# Patient Record
Sex: Female | Born: 1935 | Race: White | Hispanic: No | Smoking: Never smoker
Health system: Southern US, Community
[De-identification: ages and names within clinical notes are randomized; demographics above are authoritative.]

## PROBLEM LIST (undated history)

## (undated) DIAGNOSIS — H3530 Unspecified macular degeneration: Secondary | ICD-10-CM

## (undated) DIAGNOSIS — E039 Hypothyroidism, unspecified: Secondary | ICD-10-CM

## (undated) DIAGNOSIS — S3210XA Unspecified fracture of sacrum, initial encounter for closed fracture: Secondary | ICD-10-CM

## (undated) DIAGNOSIS — N3010 Interstitial cystitis (chronic) without hematuria: Secondary | ICD-10-CM

## (undated) DIAGNOSIS — M545 Low back pain: Secondary | ICD-10-CM

## (undated) DIAGNOSIS — E785 Hyperlipidemia, unspecified: Secondary | ICD-10-CM

## (undated) DIAGNOSIS — H409 Unspecified glaucoma: Secondary | ICD-10-CM

## (undated) DIAGNOSIS — I340 Nonrheumatic mitral (valve) insufficiency: Secondary | ICD-10-CM

## (undated) DIAGNOSIS — K635 Polyp of colon: Secondary | ICD-10-CM

## (undated) DIAGNOSIS — M810 Age-related osteoporosis without current pathological fracture: Secondary | ICD-10-CM

## (undated) DIAGNOSIS — M8580 Other specified disorders of bone density and structure, unspecified site: Secondary | ICD-10-CM

## (undated) DIAGNOSIS — F0390 Unspecified dementia without behavioral disturbance: Secondary | ICD-10-CM

## (undated) DIAGNOSIS — M4848XA Fatigue fracture of vertebra, sacral and sacrococcygeal region, initial encounter for fracture: Secondary | ICD-10-CM

## (undated) DIAGNOSIS — E079 Disorder of thyroid, unspecified: Secondary | ICD-10-CM

## (undated) DIAGNOSIS — C4490 Unspecified malignant neoplasm of skin, unspecified: Secondary | ICD-10-CM

## (undated) DIAGNOSIS — N3281 Overactive bladder: Secondary | ICD-10-CM

## (undated) HISTORY — PX: CATARACT EXTRACTION: SUR2

## (undated) HISTORY — DX: Hypothyroidism, unspecified: E03.9

## (undated) HISTORY — DX: Unspecified fracture of sacrum, initial encounter for closed fracture: S32.10XA

## (undated) HISTORY — DX: Low back pain: M54.5

## (undated) HISTORY — DX: Polyp of colon: K63.5

## (undated) HISTORY — DX: Interstitial cystitis (chronic) without hematuria: N30.10

## (undated) HISTORY — DX: Fatigue fracture of vertebra, sacral and sacrococcygeal region, initial encounter for fracture: M48.48XA

## (undated) HISTORY — DX: Unspecified malignant neoplasm of skin, unspecified: C44.90

## (undated) HISTORY — DX: Age-related osteoporosis without current pathological fracture: M81.0

## (undated) HISTORY — DX: Nonrheumatic mitral (valve) insufficiency: I34.0

## (undated) HISTORY — DX: Overactive bladder: N32.81

## (undated) HISTORY — DX: Unspecified macular degeneration: H35.30

## (undated) HISTORY — PX: TONSILLECTOMY: SUR1361

## (undated) HISTORY — DX: Hyperlipidemia, unspecified: E78.5

## (undated) HISTORY — DX: Other specified disorders of bone density and structure, unspecified site: M85.80

---

## 2001-06-14 HISTORY — PX: CHOLECYSTECTOMY: SHX55

## 2004-07-22 ENCOUNTER — Ambulatory Visit: Payer: Self-pay | Admitting: Unknown Physician Specialty

## 2005-03-11 ENCOUNTER — Ambulatory Visit: Payer: Self-pay | Admitting: Internal Medicine

## 2006-03-14 ENCOUNTER — Ambulatory Visit: Payer: Self-pay | Admitting: Internal Medicine

## 2006-07-18 ENCOUNTER — Ambulatory Visit: Payer: Self-pay | Admitting: Urology

## 2007-03-20 ENCOUNTER — Ambulatory Visit: Payer: Self-pay | Admitting: Internal Medicine

## 2008-02-01 ENCOUNTER — Ambulatory Visit: Payer: Self-pay | Admitting: Internal Medicine

## 2008-03-20 ENCOUNTER — Ambulatory Visit: Payer: Self-pay | Admitting: Internal Medicine

## 2008-10-30 ENCOUNTER — Inpatient Hospital Stay: Payer: Self-pay | Admitting: Internal Medicine

## 2008-11-23 ENCOUNTER — Emergency Department: Payer: Self-pay | Admitting: Emergency Medicine

## 2009-03-20 ENCOUNTER — Ambulatory Visit: Payer: Self-pay | Admitting: Ophthalmology

## 2009-03-21 ENCOUNTER — Ambulatory Visit: Payer: Self-pay | Admitting: Internal Medicine

## 2009-04-07 ENCOUNTER — Ambulatory Visit: Payer: Self-pay | Admitting: Ophthalmology

## 2009-05-28 ENCOUNTER — Emergency Department: Payer: Self-pay | Admitting: Emergency Medicine

## 2010-03-23 ENCOUNTER — Ambulatory Visit: Payer: Self-pay | Admitting: Internal Medicine

## 2010-06-17 ENCOUNTER — Ambulatory Visit: Payer: Self-pay | Admitting: Internal Medicine

## 2011-02-05 DIAGNOSIS — N3281 Overactive bladder: Secondary | ICD-10-CM

## 2011-02-05 HISTORY — DX: Overactive bladder: N32.81

## 2011-03-10 DIAGNOSIS — I340 Nonrheumatic mitral (valve) insufficiency: Secondary | ICD-10-CM

## 2011-03-10 DIAGNOSIS — H409 Unspecified glaucoma: Secondary | ICD-10-CM | POA: Insufficient documentation

## 2011-03-10 HISTORY — DX: Nonrheumatic mitral (valve) insufficiency: I34.0

## 2011-04-13 ENCOUNTER — Ambulatory Visit: Payer: Self-pay | Admitting: Internal Medicine

## 2012-04-13 ENCOUNTER — Ambulatory Visit: Payer: Self-pay | Admitting: Internal Medicine

## 2013-01-15 ENCOUNTER — Encounter: Payer: Self-pay | Admitting: Internal Medicine

## 2013-02-12 ENCOUNTER — Encounter: Payer: Self-pay | Admitting: Internal Medicine

## 2013-03-14 ENCOUNTER — Encounter: Payer: Self-pay | Admitting: Internal Medicine

## 2014-08-31 ENCOUNTER — Emergency Department: Payer: Self-pay | Admitting: Physician Assistant

## 2015-04-26 ENCOUNTER — Emergency Department: Payer: Medicare Other

## 2015-04-26 ENCOUNTER — Emergency Department
Admission: EM | Admit: 2015-04-26 | Discharge: 2015-04-26 | Disposition: A | Discharge status: Home / Self Care | Payer: Medicare Other | Attending: Emergency Medicine | Admitting: Emergency Medicine

## 2015-04-26 ENCOUNTER — Encounter: Payer: Self-pay | Admitting: Emergency Medicine

## 2015-04-26 DIAGNOSIS — S22088A Other fracture of T11-T12 vertebra, initial encounter for closed fracture: Secondary | ICD-10-CM | POA: Diagnosis not present

## 2015-04-26 DIAGNOSIS — Y9389 Activity, other specified: Secondary | ICD-10-CM | POA: Diagnosis not present

## 2015-04-26 DIAGNOSIS — Z880 Allergy status to penicillin: Secondary | ICD-10-CM | POA: Insufficient documentation

## 2015-04-26 DIAGNOSIS — W1839XA Other fall on same level, initial encounter: Secondary | ICD-10-CM | POA: Insufficient documentation

## 2015-04-26 DIAGNOSIS — W19XXXA Unspecified fall, initial encounter: Secondary | ICD-10-CM

## 2015-04-26 DIAGNOSIS — G8929 Other chronic pain: Secondary | ICD-10-CM | POA: Insufficient documentation

## 2015-04-26 DIAGNOSIS — Y998 Other external cause status: Secondary | ICD-10-CM | POA: Diagnosis not present

## 2015-04-26 DIAGNOSIS — Y9289 Other specified places as the place of occurrence of the external cause: Secondary | ICD-10-CM | POA: Diagnosis not present

## 2015-04-26 DIAGNOSIS — Z79899 Other long term (current) drug therapy: Secondary | ICD-10-CM | POA: Diagnosis not present

## 2015-04-26 DIAGNOSIS — S299XXA Unspecified injury of thorax, initial encounter: Secondary | ICD-10-CM | POA: Diagnosis present

## 2015-04-26 DIAGNOSIS — IMO0002 Reserved for concepts with insufficient information to code with codable children: Secondary | ICD-10-CM

## 2015-04-26 HISTORY — DX: Unspecified glaucoma: H40.9

## 2015-04-26 HISTORY — DX: Disorder of thyroid, unspecified: E07.9

## 2015-04-26 HISTORY — DX: Unspecified macular degeneration: H35.30

## 2015-04-26 LAB — CBC
HCT: 38.9 % (ref 35.0–47.0)
HEMOGLOBIN: 12.7 g/dL (ref 12.0–16.0)
MCH: 32 pg (ref 26.0–34.0)
MCHC: 32.8 g/dL (ref 32.0–36.0)
MCV: 97.7 fL (ref 80.0–100.0)
Platelets: 138 10*3/uL — ABNORMAL LOW (ref 150–440)
RBC: 3.98 MIL/uL (ref 3.80–5.20)
RDW: 13.6 % (ref 11.5–14.5)
WBC: 8.2 10*3/uL (ref 3.6–11.0)

## 2015-04-26 LAB — COMPREHENSIVE METABOLIC PANEL
ALK PHOS: 59 U/L (ref 38–126)
ALT: 18 U/L (ref 14–54)
AST: 27 U/L (ref 15–41)
Albumin: 3.4 g/dL — ABNORMAL LOW (ref 3.5–5.0)
Anion gap: 8 (ref 5–15)
BILIRUBIN TOTAL: 1 mg/dL (ref 0.3–1.2)
BUN: 18 mg/dL (ref 6–20)
CALCIUM: 9 mg/dL (ref 8.9–10.3)
CO2: 23 mmol/L (ref 22–32)
Chloride: 114 mmol/L — ABNORMAL HIGH (ref 101–111)
Creatinine, Ser: 0.72 mg/dL (ref 0.44–1.00)
GFR calc Af Amer: >60 mL/min (ref >60)
Glucose, Bld: 88 mg/dL (ref 65–99)
POTASSIUM: 4.1 mmol/L (ref 3.5–5.1)
Sodium: 145 mmol/L (ref 135–145)
TOTAL PROTEIN: 6.2 g/dL — AB (ref 6.5–8.1)

## 2015-04-26 LAB — URINALYSIS COMPLETE WITH MICROSCOPIC (ARMC ONLY)
BILIRUBIN URINE: NEGATIVE
Bacteria, UA: NONE SEEN
Glucose, UA: NEGATIVE mg/dL
Leukocytes, UA: NEGATIVE
NITRITE: NEGATIVE
PH: 5 (ref 5.0–8.0)
PROTEIN: NEGATIVE mg/dL
SPECIFIC GRAVITY, URINE: 1.011 (ref 1.005–1.030)
Squamous Epithelial / LPF: NONE SEEN

## 2015-04-26 LAB — TROPONIN I: Troponin I: <0.03 ng/mL (ref <0.031)

## 2015-04-26 MED ORDER — ACETAMINOPHEN 325 MG PO TABS
650.0000 mg | ORAL_TABLET | Freq: Once | ORAL | Status: AC
  Administered 2015-04-26: 650 mg via ORAL
  Filled 2015-04-26: qty 2

## 2015-04-26 MED ORDER — ACETAMINOPHEN 325 MG PO TABS
ORAL_TABLET | ORAL | Status: AC
  Filled 2015-04-26: qty 1

## 2015-04-26 NOTE — ED Notes (Signed)
Pt to ct 

## 2015-04-26 NOTE — ED Notes (Signed)
Per caregiver, pt was getting up to answer door for the other caregiver and pt fell and was on ground when the caregiver came in. C/o low back pain. Caregiver states she was a little wobbly when they got her up at 4pm.

## 2015-04-26 NOTE — ED Provider Notes (Addendum)
[facility] Emergency Department Provider Note  ____________________________________________   I have reviewed the triage vital signs and the nursing notes.   HISTORY  Chief Complaint Fall    HPI Tiffany Krause is a 81 y.o. female presents today complaining of a fall. According to caretaker patient does suffer from dementia and is at her baseline. Patient was seen to be going to get the door when the caretaker arrived at her house and she fell. Non-syncopal fall. She complained of back pain. Patient's chronic back pain is seen multiple different providers for back pain. She has had no numbness or weakness.  Past Medical History  Diagnosis Date  . Thyroid disease   . Glaucoma   . Macular degeneration     There are no active problems to display for this patient.   History reviewed. No pertinent past surgical history.  Current Outpatient Rx  Name  Route  Sig  Dispense  Refill  . ibandronate (BONIVA) 150 MG tablet   Oral   Take 150 mg by mouth every 30 (thirty) days. Take in the morning with a full glass of water, on an empty stomach, and do not take anything else by mouth or lie down for the next 30 min.         Marland Kitchen levothyroxine (SYNTHROID, LEVOTHROID) 88 MCG tablet   Oral   Take 88 mcg by mouth daily before breakfast.         . metoCLOPramide (REGLAN) 5 MG tablet   Oral   Take 5 mg by mouth 4 (four) times daily.         . timolol (TIMOPTIC-XR) 0.5 % ophthalmic gel-forming      1 drop daily.           Allergies Penicillins; Doxycycline hyclate; and Erythromycin  History reviewed. No pertinent family history.  Social History Social History  Substance Use Topics  . Smoking status: Never Smoker   . Smokeless tobacco: None  . Alcohol Use: 0.6 oz/week    1 Glasses of wine per week     Comment: 1-2x a month    Review of Systems Constitutional: No fever/chills Eyes: No visual changes. ENT: No sore throat. No stiff neck no neck  pain Cardiovascular: Denies chest pain. Respiratory: Denies shortness of breath. Gastrointestinal:   no vomiting.  No diarrhea.  No constipation. Genitourinary: Negative for dysuria. Musculoskeletal: Negative lower extremity swelling Skin: Negative for rash. Neurological: Negative for headaches, focal weakness or numbness. 10-point ROS otherwise negative.  ____________________________________________   PHYSICAL EXAM:  VITAL SIGNS: ED Triage Vitals  Enc Vitals Group     BP 04/26/15 1751 135/51 mmHg     Pulse Rate 04/26/15 1751 74     Resp 04/26/15 1751 16     Temp 04/26/15 1750 98.2 F (36.8 C)     Temp src --      SpO2 04/26/15 1751 94 %     Weight 04/26/15 1751 140 lb (63.504 kg)     Height 04/26/15 1751 5\' 6"  (1.676 m)     Head Cir --      Peak Flow --      Pain Score 04/26/15 1752 5     Pain Loc --      Pain Edu? --      Excl. in [place]? --     Constitutional: Alert and oriented to name and place unsure of date. Well appearing and in no acute distress. Eyes: Conjunctivae are normal. PERRL. EOMI. Head:  Atraumatic. Nose: No congestion/rhinnorhea. Mouth/Throat: Mucous membranes are moist.  Oropharynx non-erythematous. Neck: No stridor.   Nontender with no meningismus Cardiovascular: Normal rate, regular rhythm. Grossly normal heart sounds.  Good peripheral circulation. Respiratory: Normal respiratory effort.  No retractions. Lungs CTAB. Abdominal: Soft and nontender. No distention. No guarding no rebound Back:  There is no focal tenderness or step off there is tenderness around T 12 L1 with no step-off or deformity noted there are no lesions noted. there is no CVA tenderness Musculoskeletal: No lower extremity tenderness. No joint effusions, no DVT signs strong distal pulses no edema Neurologic:  Normal speech and language. No gross focal neurologic deficits are appreciated.  Skin:  Skin is warm, dry and intact. No rash noted. Psychiatric: Mood and affect are normal.  Speech and behavior are normal.  ____________________________________________   LABS (all labs ordered are listed, but only abnormal results are displayed)  Labs Reviewed  CBC - Abnormal; Notable for the following:    Platelets 138 (*)    All other components within normal limits  URINALYSIS COMPLETEWITH MICROSCOPIC ([facility] ONLY) - Abnormal; Notable for the following:    Color, Urine YELLOW (*)    APPearance CLEAR (*)    Ketones, ur TRACE (*)    Hgb urine dipstick 2+ (*)    All other components within normal limits  COMPREHENSIVE METABOLIC PANEL - Abnormal; Notable for the following:    Chloride 114 (*)    Total Protein 6.2 (*)    Albumin 3.4 (*)    All other components within normal limits  TROPONIN I   ____________________________________________  EKG  I personally interpreted any EKGs ordered by me or triage  _____________ normal sinus rhythm rate 76 bpm no acute ST admission acute ST depression normal axis unremarkable EKG _______________________________  RADIOLOGY  I reviewed any imaging ordered by me or triage that were performed during my shift ____________________________________________   PROCEDURES  Procedure(s) performed: None  Critical Care performed: None  ____________________________________________   INITIAL IMPRESSION / ASSESSMENT AND PLAN / ED COURSE  Pertinent labs & imaging results that were available during my care of the patient were reviewed by me and considered in my medical decision making (see chart for details).  The patient has right back pain. She has been walking today. She is neurologically intact. There is a T12 compression fracture. I discussed with Dr. Rudene Christians, the orthopedic surgeon who did ask me to discharge the patient if she is will to walk with close follow-up as an outpatient. I talked to her daughter who agrees with this plan, her daughter is a Engineer, drilling. Her daughter does not wish the patient be started on narcotic pain  medication. We will walk the patient and she does have close home health. It is unclear if this fracture was new or old, it was not seen in March however. ____________________________________________   FINAL CLINICAL IMPRESSION(S) / ED DIAGNOSES  Final diagnoses:  None     Schuyler Amor, MD 04/26/15 [id number]  Schuyler Amor, MD 04/26/15 2214

## 2015-04-26 NOTE — ED Notes (Signed)
Per caregiver, she is worried the dementia getting worse. Pt lives at home with caregivers that come in 2x/daily

## 2015-04-26 NOTE — ED Notes (Signed)
Pt ambulated using walker with nurse at bedside. Stable. MD notified.

## 2015-04-30 ENCOUNTER — Emergency Department: Payer: Medicare Other

## 2015-04-30 ENCOUNTER — Inpatient Hospital Stay
Admission: EM | Admit: 2015-04-30 | Discharge: 2015-05-03 | DRG: 552 | Disposition: A | Discharge status: Skilled Nursing Facility | Payer: Medicare Other | Attending: Internal Medicine | Admitting: Internal Medicine

## 2015-04-30 DIAGNOSIS — S3210XA Unspecified fracture of sacrum, initial encounter for closed fracture: Secondary | ICD-10-CM | POA: Diagnosis not present

## 2015-04-30 DIAGNOSIS — Z880 Allergy status to penicillin: Secondary | ICD-10-CM

## 2015-04-30 DIAGNOSIS — H409 Unspecified glaucoma: Secondary | ICD-10-CM | POA: Diagnosis present

## 2015-04-30 DIAGNOSIS — E039 Hypothyroidism, unspecified: Secondary | ICD-10-CM | POA: Diagnosis present

## 2015-04-30 DIAGNOSIS — W19XXXA Unspecified fall, initial encounter: Secondary | ICD-10-CM | POA: Diagnosis present

## 2015-04-30 DIAGNOSIS — Z66 Do not resuscitate: Secondary | ICD-10-CM | POA: Diagnosis present

## 2015-04-30 DIAGNOSIS — H3530 Unspecified macular degeneration: Secondary | ICD-10-CM | POA: Diagnosis present

## 2015-04-30 DIAGNOSIS — Z881 Allergy status to other antibiotic agents status: Secondary | ICD-10-CM

## 2015-04-30 DIAGNOSIS — R339 Retention of urine, unspecified: Secondary | ICD-10-CM | POA: Diagnosis not present

## 2015-04-30 DIAGNOSIS — M545 Low back pain: Secondary | ICD-10-CM | POA: Diagnosis present

## 2015-04-30 DIAGNOSIS — Z888 Allergy status to other drugs, medicaments and biological substances status: Secondary | ICD-10-CM

## 2015-04-30 DIAGNOSIS — M5459 Other low back pain: Secondary | ICD-10-CM | POA: Diagnosis present

## 2015-04-30 DIAGNOSIS — S3215XA Type 2 fracture of sacrum, initial encounter for closed fracture: Principal | ICD-10-CM | POA: Diagnosis present

## 2015-04-30 DIAGNOSIS — S22089A Unspecified fracture of T11-T12 vertebra, initial encounter for closed fracture: Secondary | ICD-10-CM

## 2015-04-30 DIAGNOSIS — F0390 Unspecified dementia without behavioral disturbance: Secondary | ICD-10-CM | POA: Diagnosis present

## 2015-04-30 HISTORY — DX: Other low back pain: M54.59

## 2015-04-30 HISTORY — DX: Unspecified dementia, unspecified severity, without behavioral disturbance, psychotic disturbance, mood disturbance, and anxiety: F03.90

## 2015-04-30 HISTORY — DX: Unspecified fracture of sacrum, initial encounter for closed fracture: S32.10XA

## 2015-04-30 LAB — CBC WITH DIFFERENTIAL/PLATELET
BASOS ABS: 0 10*3/uL (ref 0–0.1)
BASOS PCT: 1 %
EOS PCT: 2 %
Eosinophils Absolute: 0.1 10*3/uL (ref 0–0.7)
HCT: 33 % — ABNORMAL LOW (ref 35.0–47.0)
Hemoglobin: 10.8 g/dL — ABNORMAL LOW (ref 12.0–16.0)
LYMPHS PCT: 25 %
Lymphs Abs: 1.4 10*3/uL (ref 1.0–3.6)
MCH: 31.7 pg (ref 26.0–34.0)
MCHC: 32.9 g/dL (ref 32.0–36.0)
MCV: 96.6 fL (ref 80.0–100.0)
Monocytes Absolute: 0.3 10*3/uL (ref 0.2–0.9)
Monocytes Relative: 6 %
NEUTROS ABS: 3.7 10*3/uL (ref 1.4–6.5)
Neutrophils Relative %: 66 %
PLATELETS: 122 10*3/uL — AB (ref 150–440)
RBC: 3.41 MIL/uL — AB (ref 3.80–5.20)
RDW: 13.6 % (ref 11.5–14.5)
WBC: 5.5 10*3/uL (ref 3.6–11.0)

## 2015-04-30 LAB — COMPREHENSIVE METABOLIC PANEL
ALBUMIN: 3 g/dL — AB (ref 3.5–5.0)
ALT: 23 U/L (ref 14–54)
ANION GAP: 7 (ref 5–15)
AST: 36 U/L (ref 15–41)
Alkaline Phosphatase: 66 U/L (ref 38–126)
BUN: 16 mg/dL (ref 6–20)
CHLORIDE: 108 mmol/L (ref 101–111)
CO2: 27 mmol/L (ref 22–32)
Calcium: 8.4 mg/dL — ABNORMAL LOW (ref 8.9–10.3)
Creatinine, Ser: 0.73 mg/dL (ref 0.44–1.00)
Glucose, Bld: 101 mg/dL — ABNORMAL HIGH (ref 65–99)
POTASSIUM: 4 mmol/L (ref 3.5–5.1)
Sodium: 142 mmol/L (ref 135–145)
Total Bilirubin: 1 mg/dL (ref 0.3–1.2)
Total Protein: 5.9 g/dL — ABNORMAL LOW (ref 6.5–8.1)

## 2015-04-30 MED ORDER — POLYETHYLENE GLYCOL 3350 17 G PO PACK: 17.0000 g | PACK | Freq: Every day | ORAL | Status: DC | PRN

## 2015-04-30 MED ORDER — ACETAMINOPHEN 650 MG RE SUPP: 650.0000 mg | Freq: Four times a day (QID) | RECTAL | Status: DC | PRN

## 2015-04-30 MED ORDER — TRAMADOL HCL 50 MG PO TABS
50.0000 mg | ORAL_TABLET | Freq: Four times a day (QID) | ORAL | Status: DC | PRN
  Administered 2015-05-01 – 2015-05-02 (×2): 50 mg via ORAL
  Filled 2015-04-30 (×2): qty 1

## 2015-04-30 MED ORDER — OCUVITE-LUTEIN PO CAPS
1.0000 | ORAL_CAPSULE | Freq: Two times a day (BID) | ORAL | Status: DC
  Administered 2015-05-01 – 2015-05-03 (×5): 1 via ORAL
  Filled 2015-04-30 (×5): qty 1

## 2015-04-30 MED ORDER — OXYCODONE HCL 5 MG PO TABS
5.0000 mg | ORAL_TABLET | Freq: Once | ORAL | Status: AC
  Administered 2015-04-30: 5 mg via ORAL
  Filled 2015-04-30: qty 1

## 2015-04-30 MED ORDER — TIMOLOL MALEATE 0.5 % OP SOLG: 1.0000 [drp] | Freq: Every day | OPHTHALMIC | Status: DC

## 2015-04-30 MED ORDER — LEVOTHYROXINE SODIUM 88 MCG PO TABS
88.0000 ug | ORAL_TABLET | Freq: Every day | ORAL | Status: DC
  Administered 2015-05-01 – 2015-05-02 (×2): 88 ug via ORAL
  Filled 2015-04-30 (×2): qty 1

## 2015-04-30 MED ORDER — ONDANSETRON HCL 4 MG/2ML IJ SOLN
4.0000 mg | Freq: Once | INTRAMUSCULAR | Status: AC
  Administered 2015-04-30: 4 mg via INTRAVENOUS
  Filled 2015-04-30: qty 2

## 2015-04-30 MED ORDER — OXYCODONE-ACETAMINOPHEN 5-325 MG PO TABS: 2.0000 | ORAL_TABLET | Freq: Four times a day (QID) | ORAL | Status: DC | PRN

## 2015-04-30 MED ORDER — MORPHINE SULFATE (PF) 2 MG/ML IV SOLN
2.0000 mg | INTRAVENOUS | Status: DC | PRN
  Administered 2015-05-01 (×2): 2 mg via INTRAVENOUS
  Filled 2015-04-30 (×2): qty 1

## 2015-04-30 MED ORDER — ACETAMINOPHEN 325 MG PO TABS
650.0000 mg | ORAL_TABLET | Freq: Four times a day (QID) | ORAL | Status: DC | PRN
  Administered 2015-05-02 – 2015-05-03 (×2): 650 mg via ORAL
  Filled 2015-04-30 (×2): qty 2

## 2015-04-30 MED ORDER — OXYCODONE HCL 5 MG PO TABS
5.0000 mg | ORAL_TABLET | ORAL | Status: DC | PRN
  Administered 2015-04-30: 5 mg via ORAL
  Filled 2015-04-30: qty 1

## 2015-04-30 MED ORDER — ADULT MULTIVITAMIN W/MINERALS CH
1.0000 | ORAL_TABLET | Freq: Every day | ORAL | Status: DC
  Administered 2015-05-01: 1 via ORAL
  Filled 2015-04-30: qty 1

## 2015-04-30 MED ORDER — HEPARIN SODIUM (PORCINE) 5000 UNIT/ML IJ SOLN: 5000.0000 [IU] | Freq: Three times a day (TID) | INTRAMUSCULAR | Status: DC

## 2015-04-30 MED ORDER — MORPHINE SULFATE (PF) 4 MG/ML IV SOLN
4.0000 mg | Freq: Once | INTRAVENOUS | Status: AC
  Administered 2015-04-30: 4 mg via INTRAVENOUS
  Filled 2015-04-30: qty 1

## 2015-04-30 MED ORDER — ONDANSETRON HCL 4 MG/2ML IJ SOLN: 4.0000 mg | Freq: Four times a day (QID) | INTRAMUSCULAR | Status: DC | PRN

## 2015-04-30 MED ORDER — ONDANSETRON HCL 4 MG PO TABS: 4.0000 mg | ORAL_TABLET | Freq: Four times a day (QID) | ORAL | Status: DC | PRN

## 2015-04-30 MED ORDER — DOCUSATE SODIUM 100 MG PO CAPS
100.0000 mg | ORAL_CAPSULE | Freq: Two times a day (BID) | ORAL | Status: DC
  Administered 2015-05-01 – 2015-05-03 (×5): 100 mg via ORAL
  Filled 2015-04-30 (×5): qty 1

## 2015-04-30 NOTE — ED Notes (Signed)
Pt was sent to ED for MRI of back, states she was seen here on 11/12 after a fall and had CT scan and followed up Dr. Rudene Christians.. States pain is not any better.. Pain in lower back.the patient is here with a caregiver from home.. Pt has a hx of dementia.

## 2015-04-30 NOTE — H&P (Signed)
[place] [facility] [place] NAME: Tiffany Krause    MR#:  [id number]  DATE OF BIRTH:  1920/12/28   DATE OF ADMISSION:  04/30/2015  PRIMARY CARE PHYSICIAN: Idelle Crouch, MD   REQUESTING/REFERRING PHYSICIAN: Archie Balboa  CHIEF COMPLAINT:   Chief Complaint  Patient presents with  . Back Pain    HISTORY OF PRESENT ILLNESS:  Tiffany Krause  is a 81 y.o. female with a known history of hypothyroidism unspecified who is presenting after mechanical fall and back pain Patient has history of dementia history aided by family members present at bedside. She sustained a mechanical fall approximately 4 days ago. At that time she was evaluated in the emergency department, given pain medication and subsequently discharged with no acute findings. She followed up with orthopedic surgery for continued back pain approximately 2 days ago with minimal improvement. Given she was still complaining of pain now with decreased mobility she was advised present to the emergency department for further workup and evaluation. She is unable to further quantify/qualify her symptoms other than stated "I feel bad" emergency department course: MRI performed revealed acute/subacute fracture S2, S3 with chronic T11 compression fracture. She has received 3 doses of pain medications in the emergency department still complaining of pain  PAST MEDICAL HISTORY:   Past Medical History  Diagnosis Date  . Thyroid disease   . Glaucoma   . Macular degeneration   . Dementia     PAST SURGICAL HISTORY:   Past Surgical History  Procedure Laterality Date  . Cataract extraction      SOCIAL HISTORY:   Social History  Substance Use Topics  . Smoking status: Never Smoker   . Smokeless tobacco: Not on file  . Alcohol Use: 0.6 oz/week    1 Glasses of wine per week     Comment: 1-2x a month    FAMILY HISTORY:   Family History  Problem Relation Age of Onset  . Diabetes Neg  Hx     DRUG ALLERGIES:   Allergies  Allergen Reactions  . Penicillins Other (See Comments)    Reaction:  Unknown   . Doxycycline Hyclate Nausea And Vomiting  . Erythromycin Other (See Comments)    Reaction:  Unknown   . Fosamax [Alendronate] Other (See Comments)    Reaction:  Unknown     REVIEW OF SYSTEMS:  Unable to obtain given patient's mental status/medical condition   MEDICATIONS AT HOME:   Prior to Admission medications   Medication Sig Start Date End Date Taking? Authorizing Provider  acetaminophen (TYLENOL) 325 MG tablet Take 650 mg by mouth 4 (four) times daily as needed for mild pain.   Yes Historical Provider, MD  ibandronate (BONIVA) 150 MG tablet Take 150 mg by mouth every 30 (thirty) days. Pt takes on the 10th of every month.   Take in the morning with a full glass of water, on an empty stomach, and do not take anything else by mouth or lie down for the next 30 min.   Yes Historical Provider, MD  levothyroxine (SYNTHROID, LEVOTHROID) 88 MCG tablet Take 88 mcg by mouth daily before breakfast.   Yes Historical Provider, MD  Multiple Vitamin (MULTIVITAMIN WITH MINERALS) TABS tablet Take 1 tablet by mouth daily.   Yes Historical Provider, MD  Multiple Vitamins-Minerals (PRESERVISION AREDS 2) CAPS Take 1 capsule by mouth 2 (two) times daily.   Yes Historical Provider, MD  timolol (TIMOPTIC-XR) 0.5 % ophthalmic gel-forming Place 1 drop  into both eyes daily.    Yes Historical Provider, MD  traMADol (ULTRAM) 50 MG tablet Take 50 mg by mouth every 6 (six) hours as needed for moderate pain.   Yes Historical Provider, MD  oxyCODONE-acetaminophen (PERCOCET) 5-325 MG tablet Take 2 tablets by mouth every 6 (six) hours as needed for moderate pain or severe pain. 04/30/15   [name], MD      VITAL SIGNS:  Blood pressure 117/60, pulse 86, temperature 99.6 F (37.6 C), temperature source Oral, resp. rate 14, height 5\' 6"  (1.676 m), weight 130 lb (58.968 kg), SpO2 91  %.  PHYSICAL EXAMINATION:  VITAL SIGNS: Filed Vitals:   04/30/15 2001  BP: 117/60  Pulse: 86  Temp:   Resp: 12   GENERAL:81 y.o.female currently in minimal acute distress given pain.  HEAD: Normocephalic, atraumatic.  EYES: Pupils equal, round, reactive to light. Extraocular muscles intact. No scleral icterus.  MOUTH: Moist mucosal membrane. Dentition intact. No abscess noted.  EAR, NOSE, THROAT: Clear without exudates. No external lesions.  NECK: Supple. No thyromegaly. No nodules. No JVD.  PULMONARY: Clear to ascultation, without wheeze rails or rhonci. No use of accessory muscles, Good respiratory effort. good air entry bilaterally CHEST: Nontender to palpation.  CARDIOVASCULAR: S1 and S2. Tachycardic No murmurs, rubs, or gallops. No edema. Pedal pulses 2+ bilaterally.  GASTROINTESTINAL: Soft, nontender, nondistended. No masses. Positive bowel sounds. No hepatosplenomegaly.  MUSCULOSKELETAL: No swelling, clubbing, or edema. Range of motion full in all extremities.  NEUROLOGIC: Cranial nerves II through XII are intact. No gross focal neurological deficits. Sensation intact. Reflexes intact.  SKIN: No ulceration, lesions, rashes, or cyanosis. Skin warm and dry. Turgor intact.  PSYCHIATRIC: Mood, affect blunted. The patient is awake, alert and oriented to self. Insight, judgment poor.    LABORATORY PANEL:   CBC  Recent Labs Lab 04/30/15 1655  WBC 5.5  HGB 10.8*  HCT 33.0*  PLT 122*   ------------------------------------------------------------------------------------------------------------------  Chemistries   Recent Labs Lab 04/30/15 1655  NA 142  K 4.0  CL 108  CO2 27  GLUCOSE 101*  BUN 16  CREATININE 0.73  CALCIUM 8.4*  AST 36  ALT 23  ALKPHOS 66  BILITOT 1.0   ------------------------------------------------------------------------------------------------------------------  Cardiac Enzymes  Recent Labs Lab 04/26/15 1849  TROPONINI <0.03    ------------------------------------------------------------------------------------------------------------------  RADIOLOGY:  Mr Thoracic Spine Wo Contrast  04/30/2015  CLINICAL DATA:  81 year old female who fell 4 days ago with continued pain. Age indeterminate T11 compression fracture seen radiographically. Subsequent encounter. EXAM: MRI THORACIC SPINE WITHOUT CONTRAST TECHNIQUE: Multiplanar, multisequence MR imaging of the thoracic spine was performed. No intravenous contrast was administered. COMPARISON:  Lumbar radiographs 04/26/2015. FINDINGS: Limited sagittal imaging of the cervical spine is unremarkable. Moderate compression of the T11 vertebral body re - demonstrated. No associated marrow edema. Mild retropulsion of the posterior superior endplate not resulting in significant spinal stenosis. Thoracic vertebral height and alignment elsewhere within normal limits. No marrow edema or evidence of acute osseous abnormality. Capacious thoracic spinal canal. No spinal stenosis. Spinal cord signal is within normal limits at all visualized levels. Conus medullaris mostly visible at L1 and appears normal. Trace layering pleural effusions. Cardiomegaly. Ectatic thoracic aorta. Negative visualized upper abdominal viscera. IMPRESSION: 1. Chronic T11 compression fracture. No acute osseous abnormality in the thoracic spine. No significant spinal stenosis. 2. Trace bilateral pleural effusions. Electronically Signed   By: Genevie Ann M.D.   On: 04/30/2015 16:02   Mr Lumbar Spine Wo Contrast  04/30/2015  CLINICAL DATA:  81 year old female who fell 4 days ago with continued pain. Age indeterminate T11 compression fracture seen radiographically. Subsequent encounter. EXAM: MRI LUMBAR SPINE WITHOUT CONTRAST TECHNIQUE: Multiplanar, multisequence MR imaging of the lumbar spine was performed. No intravenous contrast was administered. COMPARISON:  Lumbar radiographs 04/26/2015. Thoracic MRI from today reported  separately. FINDINGS: Mild lumbar scoliosis. Chronic T11 compression fracture partially visible on these images, see thoracic study from today reported separately. Stable lumbar vertebral height and alignment, including mild grade 1 anterolisthesis at L4-L5. No lumbar marrow edema or evidence of acute osseous abnormality. However, the S2-S3 level of the sacrum is abnormal centrally. There is cortical disruption with surrounding mild marrow edema (series 4, image 10) and a small volume of presacral fluid or edema. Marrow edema also mildly tracks into the more lateral sacral ala on each side. The S1 and superior S2 levels appear to remain intact. Visualized lower thoracic spinal cord is normal with conus medularis at L1-L2. Negative visualized abdominal viscera. Mild urinary bladder distension. No significant lumbar spinal stenosis. Age congruent lumbar disc degeneration. Multilevel moderate lumbar posterior element degeneration, severe at L4-L5 in greater on the left. IMPRESSION: 1. Acute to subacute central sacral fracture at S2-S3. Trace presacral fluid or edema. 2. No acute osseous abnormality in the lumbar spine. No significant lumbar spinal stenosis. Electronically Signed   By: Genevie Ann M.D.   On: 04/30/2015 16:12    EKG:   Orders placed or performed during the hospital encounter of 04/26/15  . ED EKG  . ED EKG  . EKG    IMPRESSION AND PLAN:   81 year old Caucasian female history of dementia presenting after mechanical fall with back pain  1. Intractable low back pain/sacral fracture: Provide pain medication, add bowel regimen, consult orthopedic surgery, consult physical therapy as well as social work for likely placement versus home physical therapy 2. Hypothyroidism unspecified Synthroid 3. Venous embolism prophylactic: Heparin subcutaneous      All the records are reviewed and case discussed with ED provider. Management plans discussed with the patient, family and they are in  agreement.  CODE STATUS: DO NOT RESUSCITATE  TOTAL TIME TAKING CARE OF THIS PATIENT: 35 minutes.    Hower,  Karenann Cai.D on 04/30/2015 at 8:54 PM  Between 7am to 6pm - Pager - [phone]  After 6pm: House Pager: - [phone]  [facility]  Office  [phone]  CC: Primary care physician; Idelle Crouch, MD

## 2015-04-30 NOTE — ED Provider Notes (Signed)
[facility] Emergency Department Provider Note     Time seen: ----------------------------------------- 1:42 PM on 04/30/2015 -----------------------------------------    I have reviewed the triage vital signs and the nursing notes.   HISTORY  Chief Complaint Back Pain    HPI Tiffany Krause is a 81 y.o. female who presents ER having recently been seen here after a fall. She had a CT scan of her head in the lumbar spine x-ray which showed a T11 compression fracture. She was seen by orthopedics in follow-up, they state the pain is not any better and her back despite being on tramadol. She does have history of dementia, denies any radicular pain. Can barely walk now due to the low back pain.Reportedly her orthopedist wants her to have an MRI. Past Medical History  Diagnosis Date  . Thyroid disease   . Glaucoma   . Macular degeneration     There are no active problems to display for this patient.   Past Surgical History  Procedure Laterality Date  . Cataract extraction      Allergies Penicillins; Doxycycline hyclate; Erythromycin; and Fosamax  Social History Social History  Substance Use Topics  . Smoking status: Never Smoker   . Smokeless tobacco: None  . Alcohol Use: 0.6 oz/week    1 Glasses of wine per week     Comment: 1-2x a month    Review of Systems Constitutional: Negative for fever. Eyes: Negative for visual changes. ENT: Negative for sore throat. Cardiovascular: Negative for chest pain. Respiratory: Negative for shortness of breath. Gastrointestinal: Negative for abdominal pain, vomiting and diarrhea. Genitourinary: Negative for dysuria. Musculoskeletal: Positive for low back pain Skin: Negative for rash. Neurological: Negative for headaches, positive for weakness  10-point ROS otherwise negative.  ____________________________________________   PHYSICAL EXAM:  VITAL SIGNS: ED Triage Vitals  Enc Vitals Group   BP 04/30/15 1216 128/69 mmHg     Pulse Rate 04/30/15 1216 71     Resp 04/30/15 1216 18     Temp 04/30/15 1216 98.2 F (36.8 C)     Temp Source 04/30/15 1216 Oral     SpO2 04/30/15 1216 93 %     Weight 04/30/15 1216 130 lb (58.968 kg)     Height 04/30/15 1216 5\' 6"  (1.676 m)     Head Cir --      Peak Flow --      Pain Score 04/30/15 1217 10     Pain Loc --      Pain Edu? --      Excl. in [place]? --     Constitutional: Alert but disoriented. Well appearing and in no distress. Eyes: Conjunctivae are normal. PERRL. Normal extraocular movements. ENT   Head: Normocephalic and atraumatic.   Nose: No congestion/rhinnorhea.   Mouth/Throat: Mucous membranes are moist.   Neck: No stridor. Cardiovascular: Normal rate, regular rhythm. Normal and symmetric distal pulses are present in all extremities. No murmurs, rubs, or gallops. Respiratory: Normal respiratory effort without tachypnea nor retractions. Breath sounds are clear and equal bilaterally. No wheezes/rales/rhonchi. Gastrointestinal: Soft and nontender. No distention. No abdominal bruits.  Musculoskeletal: Nontender with normal range of motion in all extremities. No joint effusions.  No lower extremity tenderness nor edema. Lumbar spine tenderness Neurologic:  Normal speech and language. No gross focal neurologic deficits are appreciated. Speech is normal. No gait instability. Skin:  Skin is warm, dry and intact. No rash noted. ____________________________________________  ED COURSE:  Pertinent labs & imaging results that were available  during my care of the patient were reviewed by me and considered in my medical decision making (see chart for details). Patient is in no acute distress, will check basic labs and order MRI. ____________________________________________    LABS (pertinent positives/negatives)  Labs Reviewed  CBC WITH DIFFERENTIAL/PLATELET  COMPREHENSIVE METABOLIC PANEL  URINALYSIS COMPLETEWITH MICROSCOPIC  ([place])    RADIOLOGY Images were viewed by me  MRI LS spine  ____________________________________________  FINAL ASSESSMENT AND PLAN  T11 compression fracture  Plan: Patient with labs and imaging as dictated above. MRI is pending at this time. Patient be checked out to Dr. Archie Balboa.   Tiffany Newport, MD\  Tiffany Newport, MD 04/30/15 [phone]

## 2015-04-30 NOTE — ED Notes (Signed)
Pt sent over per DR Rudene Christians for MRI of back. Radiology has no appt for pt at this time.

## 2015-05-01 LAB — URINALYSIS COMPLETE WITH MICROSCOPIC (ARMC ONLY)
BILIRUBIN URINE: NEGATIVE
Bacteria, UA: NONE SEEN
GLUCOSE, UA: NEGATIVE mg/dL
LEUKOCYTES UA: NEGATIVE
Nitrite: NEGATIVE
PH: 5 (ref 5.0–8.0)
Protein, ur: NEGATIVE mg/dL
SQUAMOUS EPITHELIAL / LPF: NONE SEEN
Specific Gravity, Urine: 1.019 (ref 1.005–1.030)
WBC, UA: NONE SEEN WBC/hpf (ref 0–5)

## 2015-05-01 LAB — SURGICAL PCR SCREEN
MRSA, PCR: NEGATIVE
Staphylococcus aureus: NEGATIVE

## 2015-05-01 MED ORDER — TIMOLOL MALEATE 0.5 % OP SOLN: 1.0000 [drp] | Freq: Two times a day (BID) | OPHTHALMIC | Status: DC

## 2015-05-01 MED ORDER — TIMOLOL MALEATE 0.5 % OP SOLN
1.0000 [drp] | Freq: Two times a day (BID) | OPHTHALMIC | Status: DC
  Administered 2015-05-01 – 2015-05-03 (×5): 1 [drp] via OPHTHALMIC
  Filled 2015-05-01: qty 5

## 2015-05-01 NOTE — Progress Notes (Signed)
PT Cancellation Note  Patient Details Name: Tiffany Krause MRN: [id number] DOB: Apr 13, 1921   Cancelled Treatment:    Reason Eval/Treat Not Completed:  (See PT note for further details) Per chart review, pt with sacral fractures with pending consult to orthopedics. Will hold until ortho input and attempt at later time/date.    Janyth Contes 05/01/2015, 8:19 AM  Janyth Contes, SPT. [phone]

## 2015-05-01 NOTE — Clinical Social Work Note (Signed)
Clinical Social Work Assessment  Patient Details  Name: Tiffany Krause MRN: [id number] Date of Birth: 09-09-1920  Date of referral:  05/01/15               Reason for consult:  Facility Placement                Permission sought to share information with:  Chartered certified accountant granted to share information::  Yes, Verbal Permission Granted  Name::      [place]::   [place]   Relationship::     Contact Information:     Housing/Transportation Living arrangements for the past 2 months:  [place] of Information:  Patient, Adult Children, Power of Forensic psychologist, Other (Comment Required) Print production planner ) Patient Interpreter Needed:  None Criminal Activity/Legal Involvement Pertinent to Current Situation/Hospitalization:  No - Comment as needed Significant Relationships:  Adult Children Lives with:  Self Do you feel safe going back to the place where you live?  Yes Need for family participation in patient care:  Yes (Comment)  Care giving concerns:  Patient lives alone in [place] and has hired caregivers.    Social Worker assessment / plan:  Holiday representative (CSW) received SNF consult. PT is recommending SNF. CSW met with patient and her caregiver Juliann Pulse was at bedside. Patient was slow to answer questions and was oriented to self and place. Patient reported that the month was April. Per caregiver Juliann Pulse they are through Home instead and provide care from 8 am to 12 pm and 3 pm to 8 pm. Per caregiver patient is confused at baseline and her daughter Amyia Lodwick is POA. CSW contacted patient's daughter Marcie Bal. Per Marcie Bal she is a Engineer, drilling and lives in [place]. Marcie Bal reported that patient's husband passed away 12 years ago and patient has hired caregivers through home instead. Daughter reported that patient has traditional Medicare, principle as secondary and [employer] part D for drug coverage. CSW explained to  daughter that PT is recommending SNF. Patient is agreeable to SNF search for short term rehab in [place]. Daughter reported that she does not want long term care at facility and the goal is to bring patient home. Daughter reported that her father that passed away 12 years ago was at [facility] , [place] and [facility] for a period of time. CSW explained that patient will need a 3 night inpatient stay in order for Medicare to pay for rehab. Daughter verbalized her understanding.   FL2 complete and faxed out.   Employment status:  Disabled (Comment on whether or not currently receiving Disability), Retired Forensic scientist:  Medicare PT Recommendations:  [place] / Referral to community resources:  [place]  Patient/Family's Response to care: Daughter is agreeable to [facility] in [place].   Patient/Family's Understanding of and Emotional Response to Diagnosis, Current Treatment, and Prognosis: Patient and daughter were pleasant.   Emotional Assessment Appearance:  Appears stated age Attitude/Demeanor/Rapport:    Affect (typically observed):  Pleasant Orientation:  Oriented to Self, Oriented to Place, Fluctuating Orientation (Suspected and/or reported Sundowners) Alcohol / Substance use:  Not Applicable Psych involvement (Current and /or in the community):  No (Comment)  Discharge Needs  Concerns to be addressed:  Discharge Planning Concerns Readmission within the last 30 days:  No Current discharge risk:  Dependent with Mobility, Cognitively Impaired Barriers to Discharge:  Continued Medical Work up   [employer],  LCSW 05/01/2015, 5:51 PM

## 2015-05-01 NOTE — Clinical Social Work Placement (Signed)
   CLINICAL SOCIAL WORK PLACEMENT  NOTE  Date:  05/01/2015  Patient Details  Name: Tiffany Krause MRN: [id number] Date of Birth: Apr 01, 1921  Clinical Social Work is seeking post-discharge placement for this patient at the [place] level of care (*CSW will initial, date and re-position this form in  chart as items are completed):  Yes   Patient/family provided with [facility] Work Department's list of facilities offering this level of care within the geographic area requested by the patient (or if unable, by the patient's family).  Yes   Patient/family informed of their freedom to choose among providers that offer the needed level of care, that participate in Medicare, Medicaid or managed care program needed by the patient, have an available bed and are willing to accept the patient.  Yes   Patient/family informed of [facility]'s ownership interest in [facility] and [facility], as well as of the fact that they are under no obligation to receive care at these facilities.  PASRR submitted to EDS on 05/01/15     PASRR number received on       Existing PASRR number confirmed on       FL2 transmitted to all facilities in geographic area requested by pt/family on 05/01/15     FL2 transmitted to all facilities within larger geographic area on       Patient informed that his/her managed care company has contracts with or will negotiate with certain facilities, including the following:            Patient/family informed of bed offers received.  Patient chooses bed at       Physician recommends and patient chooses bed at      Patient to be transferred to   on  .  Patient to be transferred to facility by       Patient family notified on   of transfer.  Name of family member notified:        PHYSICIAN Please sign FL2     Additional Comment:    _______________________________________________ Loralyn Freshwater, LCSW 05/01/2015, 5:49  PM

## 2015-05-01 NOTE — Progress Notes (Signed)
[facility] [place] NAME: Tiffany Krause    MR#:  [id number]  DATE OF BIRTH:  [birth year]  SUBJECTIVE:  CHIEF COMPLAINT:   Chief Complaint  Patient presents with  . Back Pain  came with fall, and have sacral ulcer, some pain.  REVIEW OF SYSTEMS:  CONSTITUTIONAL: No fever, fatigue or weakness.  EYES: No blurred or double vision.  EARS, NOSE, AND THROAT: No tinnitus or ear pain.  RESPIRATORY: No cough, shortness of breath, wheezing or hemoptysis.  CARDIOVASCULAR: No chest pain, orthopnea, edema.  GASTROINTESTINAL: No nausea, vomiting, diarrhea or abdominal pain.  GENITOURINARY: No dysuria, hematuria.  ENDOCRINE: No polyuria, nocturia,  HEMATOLOGY: No anemia, easy bruising or bleeding SKIN: No rash or lesion. MUSCULOSKELETAL: lower back joint pain or arthritis.   NEUROLOGIC: No tingling, numbness, weakness.  PSYCHIATRY: No anxiety or depression.   ROS  DRUG ALLERGIES:   Allergies  Allergen Reactions  . Penicillins Other (See Comments)    Reaction:  Unknown   . Doxycycline Hyclate Nausea And Vomiting  . Erythromycin Other (See Comments)    Reaction:  Unknown   . Fosamax [Alendronate] Other (See Comments)    Reaction:  Unknown     VITALS:  Blood pressure 172/76, pulse 93, temperature 99 F (37.2 C), temperature source Oral, resp. rate 18, height 5\' 6"  (1.676 m), weight 60.374 kg (133 lb 1.6 oz), SpO2 92 %.  PHYSICAL EXAMINATION:  GENERAL:  81 y.o.-year-old patient lying in the bed with no acute distress.  EYES: Pupils equal, round, reactive to light and accommodation. No scleral icterus. Extraocular muscles intact.  HEENT: Head atraumatic, normocephalic. Oropharynx and nasopharynx clear.  NECK:  Supple, no jugular venous distention. No thyroid enlargement, no tenderness.  LUNGS: Normal breath sounds bilaterally, no wheezing, rales,rhonchi or crepitation. No use of accessory muscles of respiration.  CARDIOVASCULAR:  S1, S2 normal. No murmurs, rubs, or gallops.  ABDOMEN: Soft, nontender, nondistended. Bowel sounds present. No organomegaly or mass.  EXTREMITIES: No pedal edema, cyanosis, or clubbing.  NEUROLOGIC: Cranial nerves II through XII are intact. Muscle strength 4/5 in all extremities. Sensation intact. Gait not checked. Limites lower extrimities movement due to pain. PSYCHIATRIC: The patient is alert and oriented x 3.  SKIN: No obvious rash, lesion, or ulcer.   Physical Exam LABORATORY PANEL:   CBC  Recent Labs Lab 04/30/15 1655  WBC 5.5  HGB 10.8*  HCT 33.0*  PLT 122*   ------------------------------------------------------------------------------------------------------------------  Chemistries   Recent Labs Lab 04/30/15 1655  NA 142  K 4.0  CL 108  CO2 27  GLUCOSE 101*  BUN 16  CREATININE 0.73  CALCIUM 8.4*  AST 36  ALT 23  ALKPHOS 66  BILITOT 1.0   ------------------------------------------------------------------------------------------------------------------  Cardiac Enzymes  Recent Labs Lab 04/26/15 1849  TROPONINI <0.03   ------------------------------------------------------------------------------------------------------------------  RADIOLOGY:  Mr Thoracic Spine Wo Contrast  04/30/2015  CLINICAL DATA:  81 year old female who fell 4 days ago with continued pain. Age indeterminate T11 compression fracture seen radiographically. Subsequent encounter. EXAM: MRI THORACIC SPINE WITHOUT CONTRAST TECHNIQUE: Multiplanar, multisequence MR imaging of the thoracic spine was performed. No intravenous contrast was administered. COMPARISON:  Lumbar radiographs 04/26/2015. FINDINGS: Limited sagittal imaging of the cervical spine is unremarkable. Moderate compression of the T11 vertebral body re - demonstrated. No associated marrow edema. Mild retropulsion of the posterior superior endplate not resulting in significant spinal stenosis. Thoracic vertebral height and  alignment elsewhere within normal limits. No marrow edema or evidence of  acute osseous abnormality. Capacious thoracic spinal canal. No spinal stenosis. Spinal cord signal is within normal limits at all visualized levels. Conus medullaris mostly visible at L1 and appears normal. Trace layering pleural effusions. Cardiomegaly. Ectatic thoracic aorta. Negative visualized upper abdominal viscera. IMPRESSION: 1. Chronic T11 compression fracture. No acute osseous abnormality in the thoracic spine. No significant spinal stenosis. 2. Trace bilateral pleural effusions. Electronically Signed   By: Genevie Ann M.D.   On: 04/30/2015 16:02   Mr Lumbar Spine Wo Contrast  04/30/2015  CLINICAL DATA:  81 year old female who fell 4 days ago with continued pain. Age indeterminate T11 compression fracture seen radiographically. Subsequent encounter. EXAM: MRI LUMBAR SPINE WITHOUT CONTRAST TECHNIQUE: Multiplanar, multisequence MR imaging of the lumbar spine was performed. No intravenous contrast was administered. COMPARISON:  Lumbar radiographs 04/26/2015. Thoracic MRI from today reported separately. FINDINGS: Mild lumbar scoliosis. Chronic T11 compression fracture partially visible on these images, see thoracic study from today reported separately. Stable lumbar vertebral height and alignment, including mild grade 1 anterolisthesis at L4-L5. No lumbar marrow edema or evidence of acute osseous abnormality. However, the S2-S3 level of the sacrum is abnormal centrally. There is cortical disruption with surrounding mild marrow edema (series 4, image 10) and a small volume of presacral fluid or edema. Marrow edema also mildly tracks into the more lateral sacral ala on each side. The S1 and superior S2 levels appear to remain intact. Visualized lower thoracic spinal cord is normal with conus medularis at L1-L2. Negative visualized abdominal viscera. Mild urinary bladder distension. No significant lumbar spinal stenosis. Age congruent lumbar  disc degeneration. Multilevel moderate lumbar posterior element degeneration, severe at L4-L5 in greater on the left. IMPRESSION: 1. Acute to subacute central sacral fracture at S2-S3. Trace presacral fluid or edema. 2. No acute osseous abnormality in the lumbar spine. No significant lumbar spinal stenosis. Electronically Signed   By: Genevie Ann M.D.   On: 04/30/2015 16:12    ASSESSMENT AND PLAN:   Principal Problem:   Sacral fracture, closed ([place]) Active Problems:   Intractable low back pain  81 year old Caucasian female history of dementia presenting after mechanical fall with back pain  1. Intractable low back pain/sacral fracture: Provide pain medication, add bowel regimen, appreciated help by orthopedic surgery, consult physical therapy as well as social work Ortho suggested sacral kyphoplasty by neurosurgery.   Spoke ot pt's daughter - who is an Water engineer at [employer]- as per her- medicare does not cover for that type of surgery.   She will still consult some of her known doctors and let us know final decision soon.  2. Hypothyroidism unspecified Synthroid 3. Venous embolism prophylactic: Heparin subcutaneous    All the records are reviewed and case discussed with Care Management/Social Worker. Management plans discussed with the patient, family and they are in agreement.  CODE STATUS: full  TOTAL TIME TAKING CARE OF THIS PATIENT: 35 minutes.   POSSIBLE D/C IN 1-2 DAYS, DEPENDING ON CLINICAL CONDITION.   Vaughan Basta M.D on 05/01/2015   Between 7am to 6pm - Pager - [phone]  After 6pm go to [email or web address] - password [email or web address] [facility]  Office  [phone]  CC: Primary care physician; Idelle Crouch, MD  Note: This dictation was prepared with Dragon dictation along with smaller phrase technology. Any transcriptional errors that result from this process are unintentional.

## 2015-05-01 NOTE — NC FL2 (Signed)
[place] LEVEL OF CARE SCREENING TOOL     IDENTIFICATION  Patient Name: Tiffany Krause Birthdate: April 16, 1921 Sex: female Admission Date (Current Location): 04/30/2015  [facility] and [place] Number: Engineering geologist and Address:  [facility], [address], [place], [place] [address]      Provider Number: [id number]  Attending Physician Name and Address:  Max Sane, MD  Relative Name and Phone Number:       Current Level of Care: Hospital Recommended Level of Care: [place] Prior Approval Number:    Date Approved/Denied:   PASRR Number:    Discharge Plan: SNF    Current Diagnoses: Patient Active Problem List   Diagnosis Date Noted  . Sacral fracture, closed ([place]) 04/30/2015  . Intractable low back pain 04/30/2015    Orientation ACTIVITIES/SOCIAL BLADDER RESPIRATION    Self, Place  Active Continent Normal (2 liters O2)  BEHAVIORAL SYMPTOMS/MOOD NEUROLOGICAL BOWEL NUTRITION STATUS   (none)  (none) Continent Diet (2gm na)  PHYSICIAN VISITS COMMUNICATION OF NEEDS Height & Weight Skin  30 days Verbally   133 lbs. Normal          AMBULATORY STATUS RESPIRATION    Supervision limited Normal (2 liters O2)      Personal Care Assistance Level of Assistance  Dressing, Bathing Bathing Assistance: Limited assistance   Dressing Assistance: Limited assistance      Functional Limitations Info  Sight Sight Info: Impaired           SPECIAL CARE FACTORS FREQUENCY  PT (By licensed PT)                   Additional Factors Info  Code Status, Allergies Code Status Info: DNR Allergies Info: pcn's, doxycycline, erythromycin, fosomax           Current Medications (05/01/2015): Current Facility-Administered Medications  Medication Dose Route Frequency Provider Last Rate Last Dose  . acetaminophen (TYLENOL) tablet 650 mg  650 mg Oral Q6H PRN Lytle Butte, MD       Or  . acetaminophen  (TYLENOL) suppository 650 mg  650 mg Rectal Q6H PRN Lytle Butte, MD      . docusate sodium (COLACE) capsule 100 mg  100 mg Oral BID Lytle Butte, MD   100 mg at 05/01/15 0925  . levothyroxine (SYNTHROID, LEVOTHROID) tablet 88 mcg  88 mcg Oral QAC breakfast Lytle Butte, MD   88 mcg at 05/01/15 [id number]  . morphine 2 MG/ML injection 2 mg  2 mg Intravenous Q4H PRN Lytle Butte, MD   2 mg at 05/01/15 1412  . multivitamin with minerals tablet 1 tablet  1 tablet Oral Daily Lytle Butte, MD   1 tablet at 05/01/15 0925  . multivitamin-lutein (OCUVITE-LUTEIN) capsule 1 capsule  1 capsule Oral BID Lytle Butte, MD   1 capsule at 05/01/15 [id number]  . ondansetron (ZOFRAN) tablet 4 mg  4 mg Oral Q6H PRN Lytle Butte, MD       Or  . ondansetron [facility]) injection 4 mg  4 mg Intravenous Q6H PRN Lytle Butte, MD      . polyethylene glycol (MIRALAX / GLYCOLAX) packet 17 g  17 g Oral Daily PRN Lytle Butte, MD      . timolol (TIMOPTIC) 0.5 % ophthalmic solution 1 drop  1 drop Both Eyes BID Lytle Butte, MD   1 drop at 05/01/15 [id number]  . traMADol (ULTRAM) tablet  50 mg  50 mg Oral Q6H PRN Lytle Butte, MD   50 mg at 05/01/15 1041   Do not use this list as official medication orders. Please verify with discharge summary.  Discharge Medications:   Medication List    TAKE these medications        oxyCODONE-acetaminophen 5-325 MG tablet  Commonly known as:  PERCOCET  Take 2 tablets by mouth every 6 (six) hours as needed for moderate pain or severe pain.      ASK your doctor about these medications        acetaminophen 325 MG tablet  Commonly known as:  TYLENOL  Take 650 mg by mouth 4 (four) times daily as needed for mild pain.     ibandronate 150 MG tablet  Commonly known as:  BONIVA  Take 150 mg by mouth every 30 (thirty) days. Pt takes on the 10th of every month.   Take in the morning with a full glass of water, on an empty stomach, and do not take anything else by mouth or lie down for the next 30  min.     levothyroxine 88 MCG tablet  Commonly known as:  SYNTHROID, LEVOTHROID  Take 88 mcg by mouth daily before breakfast.     multivitamin with minerals Tabs tablet  Take 1 tablet by mouth daily.     PRESERVISION AREDS 2 Caps  Take 1 capsule by mouth 2 (two) times daily.     timolol 0.5 % ophthalmic gel-forming  Commonly known as:  TIMOPTIC-XR  Place 1 drop into both eyes daily.     traMADol 50 MG tablet  Commonly known as:  ULTRAM  Take 50 mg by mouth every 6 (six) hours as needed for moderate pain.        Relevant Imaging Results:  Relevant Lab Results:  Recent Labs    Additional Information SS: [id number]  Shela Leff, LCSW

## 2015-05-01 NOTE — Progress Notes (Signed)
Patient pulled out her foley catheter. MD notified. No news orders received.

## 2015-05-01 NOTE — Evaluation (Signed)
Physical Therapy Evaluation Patient Details Name: Tiffany Krause MRN: [id number] DOB: 12/04/1920 Today's Date: 05/01/2015   History of Present Illness  Pt was admitted to the hospital after a fall which revealed sacral fractures of S2 and S3.   Clinical Impression  Pt presents with thyroid disease, glaucoma, macular degeneration, and dementia. Examination reveals that pt performs bed mobility at mod A, transfers at min A, and ambulation at min A but with greatly reduced distances. Her confusion somewhat limits her, however she is still willing and able to perform sit-to-stands and walking with therapy. Pt history not reliable - although she is confused, she understands the role of therapy by verbally expressing that she "knows she needs to move with therapy." Pt has primary deficits of decreased activity tolerance and generalized weakness. She will continue to benefit from skilled PT in order for her to eventually return safely to PLOF.     Follow Up Recommendations SNF    Equipment Recommendations   (TBD)    Recommendations for Other Services       Precautions / Restrictions Precautions Precautions: Fall Restrictions Weight Bearing Restrictions: No      Mobility  Bed Mobility Overal bed mobility: Needs Assistance Bed Mobility: Supine to Sit     Supine to sit: Mod assist     General bed mobility comments: Pt needs assist for trunk but can successfully mange LEs.   Transfers Overall transfer level: Needs assistance Equipment used: Rolling walker (2 wheeled) Transfers: Sit to/from Stand Sit to Stand: Min assist         General transfer comment: Pt requires assist to get all the way into standing and cues for posture. Pt is very impulsive and continuously tries to stand regardless of cues.   Ambulation/Gait Ambulation/Gait assistance: Min assist Ambulation Distance (Feet): 15 Feet Assistive device: Rolling walker (2 wheeled) Gait Pattern/deviations: Step-to  pattern;Decreased step length - right;Decreased step length - left;Decreased stride length;Shuffle Gait velocity: decreased Gait velocity interpretation: <1.8 ft/sec, indicative of risk for recurrent falls General Gait Details: Pt ambulates from bed to [facility] and back to bed. She needs constant assist for direction of RW and cues for sequencing. Pt not buckling with ambulation  (3 x 5 ft ambulation)  Stairs            Wheelchair Mobility    Modified Rankin (Stroke Patients Only)       Balance Overall balance assessment: History of Falls                                           Pertinent Vitals/Pain Pain Assessment: No/denies pain    Home Living Family/patient expects to be discharged to:: Private residence Living Arrangements: Alone Available Help at Discharge: Personal care attendant (states she "lives with husband and 2 daughters") Type of Home: House Home Access: Level entry     Home Layout: Two level;Able to live on main level with bedroom/bathroom Home Equipment:  (unkown) Additional Comments: All home and personal hx suspect secondary to cognitive impairments    Prior Function           Comments: Pt was ambulating household distances with RW and performing ADLs with assistance from caregiver      Hand Dominance        Extremity/Trunk Assessment   Upper Extremity Assessment: Generalized weakness  Lower Extremity Assessment: Generalized weakness (Grossly 3+/5 MMT bilat LEs)         Communication   Communication: No difficulties  Cognition Arousal/Alertness: Awake/alert Behavior During Therapy: WFL for tasks assessed/performed Overall Cognitive Status: No family/caregiver present to determine baseline cognitive functioning                      General Comments General comments (skin integrity, edema, etc.): Pt confused and repeatedly trying to get out of recliner. Also she complains about discomfert at her  foley site. Pt placed back in bed.    Exercises Other Exercises Other Exercises: Pt was assisted to the [facility] where she attempted to have a BM. Pt needs education on appropriate transfer techniques and safety measures      Assessment/Plan    PT Assessment Patient needs continued PT services  PT Diagnosis Difficulty walking;Abnormality of gait;Generalized weakness;Acute pain   PT Problem List Decreased strength;Decreased balance;Decreased mobility;Decreased cognition;Decreased knowledge of use of DME;Decreased safety awareness;Pain  PT Treatment Interventions DME instruction;Gait training;Stair training;Functional mobility training;Therapeutic activities;Therapeutic exercise;Balance training;Neuromuscular re-education   PT Goals (Current goals can be found in the Care Plan section) Acute Rehab PT Goals Patient Stated Goal: none stated PT Goal Formulation: With patient Time For Goal Achievement: 05/15/15 Potential to Achieve Goals: Fair    Frequency Min 2X/week   Barriers to discharge        Co-evaluation               End of Session Equipment Utilized During Treatment: Gait belt Activity Tolerance: Patient limited by pain (Limited by confusion) Patient left: in bed;with call bell/phone within reach;with bed alarm set;with family/visitor present Nurse Communication: Mobility status         Time: [id number] PT Time Calculation (min) (ACUTE ONLY): 29 min   Charges:         PT G CodesJanyth Contes 05/29/15, 4:05 PM Janyth Contes, SPT. [phone]

## 2015-05-01 NOTE — Progress Notes (Signed)
CHG wipe done by RN and Nurse assist.

## 2015-05-01 NOTE — Consult Note (Signed)
ORTHOPAEDIC CONSULTATION  REQUESTING PHYSICIAN: Max Sane, MD  Chief Complaint:   Low back pain.  History of Present Illness: Tiffany Krause is a 81 y.o. female who lives at home with her daughter. The patient has a history of poor balance. Apparently she fell several days ago and injured her back. She subsequently fell again yesterday, aggravating her symptoms. She was brought to the emergency room and subsequently admitted for pain control and further workup. An MRI scan of the lumbar spine has demonstrated sacral insufficiency fractures at S2 and S3. The patient denies any associated injuries as a result of these falls. She also denies any lightheadedness, dizziness, chest pain, or other symptoms that may have precipitated these falls. Dyes any numbness or paresthesias down either lower extremity, and denies any bowel or bladder complaints.  Past Medical History  Diagnosis Date  . Thyroid disease   . Glaucoma   . Macular degeneration   . Dementia    Past Surgical History  Procedure Laterality Date  . Cataract extraction     Social History   Social History  . Marital Status: Widowed    Spouse Name: N/A  . Number of Children: N/A  . Years of Education: N/A   Social History Main Topics  . Smoking status: Never Smoker   . Smokeless tobacco: None  . Alcohol Use: 0.6 oz/week    1 Glasses of wine per week     Comment: 1-2x a month  . Drug Use: None  . Sexual Activity: Not Asked   Other Topics Concern  . None   Social History Narrative   Family History  Problem Relation Age of Onset  . Diabetes Neg Hx    Allergies  Allergen Reactions  . Penicillins Other (See Comments)    Reaction:  Unknown   . Doxycycline Hyclate Nausea And Vomiting  . Erythromycin Other (See Comments)    Reaction:  Unknown   . Fosamax [Alendronate] Other (See Comments)    Reaction:  Unknown    Prior to Admission medications    Medication Sig Start Date End Date Taking? Authorizing Provider  acetaminophen (TYLENOL) 325 MG tablet Take 650 mg by mouth 4 (four) times daily as needed for mild pain.   Yes Historical Provider, MD  ibandronate (BONIVA) 150 MG tablet Take 150 mg by mouth every 30 (thirty) days. Pt takes on the 10th of every month.   Take in the morning with a full glass of water, on an empty stomach, and do not take anything else by mouth or lie down for the next 30 min.   Yes Historical Provider, MD  levothyroxine (SYNTHROID, LEVOTHROID) 88 MCG tablet Take 88 mcg by mouth daily before breakfast.   Yes Historical Provider, MD  Multiple Vitamin (MULTIVITAMIN WITH MINERALS) TABS tablet Take 1 tablet by mouth daily.   Yes Historical Provider, MD  Multiple Vitamins-Minerals (PRESERVISION AREDS 2) CAPS Take 1 capsule by mouth 2 (two) times daily.   Yes Historical Provider, MD  timolol (TIMOPTIC-XR) 0.5 % ophthalmic gel-forming Place 1 drop into both eyes daily.    Yes Historical Provider, MD  traMADol (ULTRAM) 50 MG tablet Take 50 mg by mouth every 6 (six) hours as needed for moderate pain.   Yes Historical Provider, MD  oxyCODONE-acetaminophen (PERCOCET) 5-325 MG tablet Take 2 tablets by mouth every 6 (six) hours as needed for moderate pain or severe pain. 04/30/15   [name], MD   Mr Thoracic Spine Wo Contrast  04/30/2015  CLINICAL  DATA:  81 year old female who fell 4 days ago with continued pain. Age indeterminate T11 compression fracture seen radiographically. Subsequent encounter. EXAM: MRI THORACIC SPINE WITHOUT CONTRAST TECHNIQUE: Multiplanar, multisequence MR imaging of the thoracic spine was performed. No intravenous contrast was administered. COMPARISON:  Lumbar radiographs 04/26/2015. FINDINGS: Limited sagittal imaging of the cervical spine is unremarkable. Moderate compression of the T11 vertebral body re - demonstrated. No associated marrow edema. Mild retropulsion of the posterior superior  endplate not resulting in significant spinal stenosis. Thoracic vertebral height and alignment elsewhere within normal limits. No marrow edema or evidence of acute osseous abnormality. Capacious thoracic spinal canal. No spinal stenosis. Spinal cord signal is within normal limits at all visualized levels. Conus medullaris mostly visible at L1 and appears normal. Trace layering pleural effusions. Cardiomegaly. Ectatic thoracic aorta. Negative visualized upper abdominal viscera. IMPRESSION: 1. Chronic T11 compression fracture. No acute osseous abnormality in the thoracic spine. No significant spinal stenosis. 2. Trace bilateral pleural effusions. Electronically Signed   By: Genevie Ann M.D.   On: 04/30/2015 16:02   Mr Lumbar Spine Wo Contrast  04/30/2015  CLINICAL DATA:  81 year old female who fell 4 days ago with continued pain. Age indeterminate T11 compression fracture seen radiographically. Subsequent encounter. EXAM: MRI LUMBAR SPINE WITHOUT CONTRAST TECHNIQUE: Multiplanar, multisequence MR imaging of the lumbar spine was performed. No intravenous contrast was administered. COMPARISON:  Lumbar radiographs 04/26/2015. Thoracic MRI from today reported separately. FINDINGS: Mild lumbar scoliosis. Chronic T11 compression fracture partially visible on these images, see thoracic study from today reported separately. Stable lumbar vertebral height and alignment, including mild grade 1 anterolisthesis at L4-L5. No lumbar marrow edema or evidence of acute osseous abnormality. However, the S2-S3 level of the sacrum is abnormal centrally. There is cortical disruption with surrounding mild marrow edema (series 4, image 10) and a small volume of presacral fluid or edema. Marrow edema also mildly tracks into the more lateral sacral ala on each side. The S1 and superior S2 levels appear to remain intact. Visualized lower thoracic spinal cord is normal with conus medularis at L1-L2. Negative visualized abdominal viscera. Mild  urinary bladder distension. No significant lumbar spinal stenosis. Age congruent lumbar disc degeneration. Multilevel moderate lumbar posterior element degeneration, severe at L4-L5 in greater on the left. IMPRESSION: 1. Acute to subacute central sacral fracture at S2-S3. Trace presacral fluid or edema. 2. No acute osseous abnormality in the lumbar spine. No significant lumbar spinal stenosis. Electronically Signed   By: Genevie Ann M.D.   On: 04/30/2015 16:12    Positive ROS: All other systems have been reviewed and were otherwise negative with the exception of those mentioned in the HPI and as above.  Physical Exam: General:  Alert, no acute distress Psychiatric:  Patient is competent for consent with normal mood, although her affect is somewhat flat   Cardiovascular:  No pedal edema Respiratory:  No wheezing, non-labored breathing GI:  Abdomen is soft and non-tender Skin:  No lesions in the area of chief complaint Neurologic:  Sensation intact distally Lymphatic:  No axillary or cervical lymphadenopathy  Orthopedic Exam:  Orthopedic examination is limited to the lumbar spine and lower extremities. The patient has moderate tenderness to percussion over the lower back and sacral region. Skin inspection of this area is unremarkable. She is able to actively dorsiflex and plantarflex her toes and ankle without difficulty. She has intact sensation to light touch to both lower extremities and feet. She has good capillary refill to both feet.  X-rays:  An MRI scan of the lumbar spine has been obtained and is available for review. By report, the MRI scan demonstrates sacral insufficiency fractures at S2 and S3. There also is an old T11 compression fracture. These films also demonstrated some degenerative changes with a grade 1 anterolisthesis of L4 on L5.  Assessment: Sacral insufficiency fractures involving S2 and S3.  Plan: The treatment options are discussed with the patient. The patient states  that she isn't quite a bit of pain and would like to consider more aggressive treatment options. She would be a good candidate for a kyphoplasty of the sacrum. However, this procedure is not done at our hospital. I spoke with Dr. Rudene Christians, who does the vertebral kyphoplasties for [place], but he does not do sacral kyphoplasties. However, he has given me the name of a Dr. Estanislado Pandy, a neurosurgeon in [place], who does perform this procedure. Meanwhile, the patient should be kept comfortable with appropriate pain medication. She may be mobilized as tolerated with physical therapy. Most likely, she will need short-term rehabilitation placement.  Thank you for asking up it is been in the care of this pleasant but unfortunate woman. I will try to get the contact information for Dr. Estanislado Pandy for you if you would like to pursue this option. If the patient does pursue the procedure with Dr. Vertell Limber, then he she will follow up with Dr. Estanislado Pandy as per his recommendations. If she does not, I will be happy to see her back in my office in about 1 month.   Tiffany Lux, MD  Beeper #:  [phone]  05/01/2015 9:12 AM  Juliet Rude, MD [facility] [address] [place], [place], [place]  [phone]

## 2015-05-01 NOTE — Care Management (Addendum)
Met with patient who seems a bit confused as she is listed as being widowed but states she lives with her husband. I understand that patient has private duty care at home- patient agreed with that. She did agree that her daughter Tanise Russman is a physician- I do not see her contact number listed. There is a female friend listed. Ortho Dr. Roland Rack is recommending patient to be seen by Dr. Estanislado Pandy, a neurosurgeon in [place]. Dr. Roland Rack is currently in surgery. Spoke with Dr. Manuella Ghazi and if ortho can establish a surgeon at tertiary hospital transportation needs can be arranged through [facility] and transfer can be arranged.   Update: Dr. Chapman Fitch is a OB/GYN with [facility] according to her [place] office [phone]. Dr. Lovina Reach contact is Octaviano Glow [phone]; Message left for Santiago Glad to have Dr. Amie Critchley contact this RNCM to discuss plan of treatment and obtain best contact number.

## 2015-05-02 ENCOUNTER — Encounter
Admission: RE | Admit: 2015-05-02 | Discharge: 2015-05-02 | Disposition: A | Discharge status: Home / Self Care | Payer: Medicare Other | Source: Ambulatory Visit | Attending: Internal Medicine | Admitting: Internal Medicine

## 2015-05-02 DIAGNOSIS — M4848XA Fatigue fracture of vertebra, sacral and sacrococcygeal region, initial encounter for fracture: Secondary | ICD-10-CM

## 2015-05-02 HISTORY — DX: Fatigue fracture of vertebra, sacral and sacrococcygeal region, initial encounter for fracture: M48.48XA

## 2015-05-02 MED ORDER — TRAMADOL HCL 50 MG PO TABS: 50.0000 mg | ORAL_TABLET | Freq: Four times a day (QID) | ORAL | Status: AC | PRN

## 2015-05-02 MED ORDER — CALCIUM CITRATE-VITAMIN D 250-100 MG-UNIT PO TABS: 1.0000 | ORAL_TABLET | Freq: Two times a day (BID) | ORAL | Status: AC

## 2015-05-02 MED ORDER — SODIUM CHLORIDE 0.9 % IV BOLUS (SEPSIS): 1000.0000 mL | Freq: Once | INTRAVENOUS | Status: DC

## 2015-05-02 MED ORDER — OXYCODONE-ACETAMINOPHEN 5-325 MG PO TABS: 2.0000 | ORAL_TABLET | Freq: Four times a day (QID) | ORAL | Status: AC | PRN

## 2015-05-02 MED ORDER — POLYETHYLENE GLYCOL 3350 17 G PO PACK: 17.0000 g | PACK | Freq: Every day | ORAL | Status: AC | PRN

## 2015-05-02 MED ORDER — HYDROCOD POLST-CPM POLST ER 10-8 MG/5ML PO SUER
ORAL | Status: AC
  Filled 2015-05-02: qty 5

## 2015-05-02 MED ORDER — TAMSULOSIN HCL 0.4 MG PO CAPS: 0.4000 mg | ORAL_CAPSULE | Freq: Every day | ORAL | Status: AC

## 2015-05-02 MED ORDER — DOCUSATE SODIUM 100 MG PO CAPS: 100.0000 mg | ORAL_CAPSULE | Freq: Two times a day (BID) | ORAL | Status: AC

## 2015-05-02 MED ORDER — TAMSULOSIN HCL 0.4 MG PO CAPS
0.4000 mg | ORAL_CAPSULE | Freq: Every day | ORAL | Status: DC
  Administered 2015-05-02 – 2015-05-03 (×2): 0.4 mg via ORAL
  Filled 2015-05-02 (×2): qty 1

## 2015-05-02 MED ORDER — OXYCODONE-ACETAMINOPHEN 5-325 MG PO TABS: 1.0000 | ORAL_TABLET | Freq: Four times a day (QID) | ORAL | Status: DC | PRN

## 2015-05-02 NOTE — Progress Notes (Signed)
Physical Therapy Treatment Patient Details Name: Tiffany Krause MRN: [id number] DOB: 04-Feb-1921 Today's Date: 05/02/2015    History of Present Illness Pt was admitted to the hospital after a fall which revealed sacral fractures of S2 and S3.     PT Comments    Pt willing to participate in therapeutic exercise this morning, however it is noted that pt is c/o pain more today than yesterday. Pt unable to maintain sitting balance secondary to this pain. Nursing was notified. Although she has strength, mobility deficits and acute pain, she is still cooperative and will continue to benefit from skilled PT in order to return to optimal PLOF. PT to continue therex and OOB mobility as tolerated by pt.   Follow Up Recommendations  SNF     Equipment Recommendations  Rolling walker with 5" wheels    Recommendations for Other Services       Precautions / Restrictions Precautions Precautions: Fall Restrictions Weight Bearing Restrictions: No (limited ambulation)    Mobility  Bed Mobility Overal bed mobility: Needs Assistance Bed Mobility: Supine to Sit     Supine to sit: Mod assist     General bed mobility comments:  (significant c/o pain with bed mobility this morning)  Transfers Overall transfer level:  (not attempted secondary to pain)                  Ambulation/Gait Ambulation/Gait assistance:  (Not appropriate secondary to pain)               Stairs            Wheelchair Mobility    Modified Rankin (Stroke Patients Only)       Balance Overall balance assessment: History of Falls                                  Cognition Arousal/Alertness: Awake/alert Behavior During Therapy: Restless (secondary to pain) Overall Cognitive Status: No family/caregiver present to determine baseline cognitive functioning                      Exercises Other Exercises Other Exercises: Pt able to perform bilateral therex x 10 reps at  min A for proper technique. Needs cues to continue to attend, but is able to do so. Also slightly limited by pain. Exercises performed: ankle pumps, heel slides, SAQ, and hip abd Other Exercises: Sat EOB x 1 min but further mobility deferred secondary to pt not being able to hold sitting balance due to pain in low back. Nursing notified of current pain and mobility    General Comments        Pertinent Vitals/Pain Pain Assessment:  (c/o LBP - does not quantify )    Home Living                      Prior Function            PT Goals (current goals can now be found in the care plan section) Acute Rehab PT Goals Patient Stated Goal: none stated PT Goal Formulation: With patient Time For Goal Achievement: 05/15/15 Potential to Achieve Goals: Fair Progress towards PT goals: Progressing toward goals    Frequency  7X/week    PT Plan Current plan remains appropriate    Co-evaluation             End of Session Equipment Utilized During Treatment: Gait  belt Activity Tolerance: Patient limited by pain Patient left: in bed;with call bell/phone within reach;with bed alarm set     Time: [id number] PT Time Calculation (min) (ACUTE ONLY): 10 min  Charges:  $Therapeutic Exercise: 8-22 mins                    G CodesJanyth Contes May 21, 2015, 11:42 AM  Janyth Contes, SPT. [phone]

## 2015-05-02 NOTE — Progress Notes (Signed)
[place] [facility] [place] NAME: Tiffany Krause    MR#:  [id number]  DATE OF BIRTH:  Sep 01, 1920  SUBJECTIVE:  CHIEF COMPLAINT:   Chief Complaint  Patient presents with  . Back Pain  came with fall, and have sacral fracture, have pain. Foley was d/c last night- she did not urinate in night- now have >900 ml on bladder scan- will reposition foley.  REVIEW OF SYSTEMS:  CONSTITUTIONAL: No fever, fatigue or weakness.  EYES: No blurred or double vision.  EARS, NOSE, AND THROAT: No tinnitus or ear pain.  RESPIRATORY: No cough, shortness of breath, wheezing or hemoptysis.  CARDIOVASCULAR: No chest pain, orthopnea, edema.  GASTROINTESTINAL: No nausea, vomiting, diarrhea , positive for lower abdominal pain- due to bladder.  GENITOURINARY: No dysuria, hematuria.  ENDOCRINE: No polyuria, nocturia,  HEMATOLOGY: No anemia, easy bruising or bleeding SKIN: No rash or lesion. MUSCULOSKELETAL: lower back joint pain or arthritis.   NEUROLOGIC: No tingling, numbness, weakness.  PSYCHIATRY: No anxiety or depression.   ROS  DRUG ALLERGIES:   Allergies  Allergen Reactions  . Penicillins Other (See Comments)    Reaction:  Unknown   . Doxycycline Hyclate Nausea And Vomiting  . Erythromycin Other (See Comments)    Reaction:  Unknown   . Fosamax [Alendronate] Other (See Comments)    Reaction:  Unknown     VITALS:  Blood pressure 153/66, pulse 88, temperature 98 F (36.7 C), temperature source Oral, resp. rate 18, height 5\' 6"  (1.676 m), weight 60.374 kg (133 lb 1.6 oz), SpO2 97 %.  PHYSICAL EXAMINATION:  GENERAL:  81 y.o.-year-old patient lying in the bed with no acute distress.  EYES: Pupils equal, round, reactive to light and accommodation. No scleral icterus. Extraocular muscles intact.  HEENT: Head atraumatic, normocephalic. Oropharynx and nasopharynx clear.  NECK:  Supple, no jugular venous distention. No thyroid enlargement, no tenderness.   LUNGS: Normal breath sounds bilaterally, no wheezing, rales,rhonchi or crepitation. No use of accessory muscles of respiration.  CARDIOVASCULAR: S1, S2 normal. No murmurs, rubs, or gallops.  ABDOMEN: Soft, suprapubic tender, nondistended. Bowel sounds present. No organomegaly or mass.  EXTREMITIES: No pedal edema, cyanosis, or clubbing.  NEUROLOGIC: Cranial nerves II through XII are intact. Muscle strength 4/5 in all extremities. Sensation intact. Gait not checked. Limites lower extrimities movement due to pain. PSYCHIATRIC: The patient is alert and oriented x 3.  SKIN: No obvious rash, lesion, or ulcer.   Physical Exam LABORATORY PANEL:   CBC  Recent Labs Lab 04/30/15 1655  WBC 5.5  HGB 10.8*  HCT 33.0*  PLT 122*   ------------------------------------------------------------------------------------------------------------------  Chemistries   Recent Labs Lab 04/30/15 1655  NA 142  K 4.0  CL 108  CO2 27  GLUCOSE 101*  BUN 16  CREATININE 0.73  CALCIUM 8.4*  AST 36  ALT 23  ALKPHOS 66  BILITOT 1.0   ------------------------------------------------------------------------------------------------------------------  Cardiac Enzymes  Recent Labs Lab 04/26/15 1849  TROPONINI <0.03   ------------------------------------------------------------------------------------------------------------------  RADIOLOGY:  Mr Thoracic Spine Wo Contrast  04/30/2015  CLINICAL DATA:  81 year old female who fell 4 days ago with continued pain. Age indeterminate T11 compression fracture seen radiographically. Subsequent encounter. EXAM: MRI THORACIC SPINE WITHOUT CONTRAST TECHNIQUE: Multiplanar, multisequence MR imaging of the thoracic spine was performed. No intravenous contrast was administered. COMPARISON:  Lumbar radiographs 04/26/2015. FINDINGS: Limited sagittal imaging of the cervical spine is unremarkable. Moderate compression of the T11 vertebral body re - demonstrated. No  associated marrow edema.  Mild retropulsion of the posterior superior endplate not resulting in significant spinal stenosis. Thoracic vertebral height and alignment elsewhere within normal limits. No marrow edema or evidence of acute osseous abnormality. Capacious thoracic spinal canal. No spinal stenosis. Spinal cord signal is within normal limits at all visualized levels. Conus medullaris mostly visible at L1 and appears normal. Trace layering pleural effusions. Cardiomegaly. Ectatic thoracic aorta. Negative visualized upper abdominal viscera. IMPRESSION: 1. Chronic T11 compression fracture. No acute osseous abnormality in the thoracic spine. No significant spinal stenosis. 2. Trace bilateral pleural effusions. Electronically Signed   By: Genevie Ann M.D.   On: 04/30/2015 16:02   Mr Lumbar Spine Wo Contrast  04/30/2015  CLINICAL DATA:  81 year old female who fell 4 days ago with continued pain. Age indeterminate T11 compression fracture seen radiographically. Subsequent encounter. EXAM: MRI LUMBAR SPINE WITHOUT CONTRAST TECHNIQUE: Multiplanar, multisequence MR imaging of the lumbar spine was performed. No intravenous contrast was administered. COMPARISON:  Lumbar radiographs 04/26/2015. Thoracic MRI from today reported separately. FINDINGS: Mild lumbar scoliosis. Chronic T11 compression fracture partially visible on these images, see thoracic study from today reported separately. Stable lumbar vertebral height and alignment, including mild grade 1 anterolisthesis at L4-L5. No lumbar marrow edema or evidence of acute osseous abnormality. However, the S2-S3 level of the sacrum is abnormal centrally. There is cortical disruption with surrounding mild marrow edema (series 4, image 10) and a small volume of presacral fluid or edema. Marrow edema also mildly tracks into the more lateral sacral ala on each side. The S1 and superior S2 levels appear to remain intact. Visualized lower thoracic spinal cord is normal with  conus medularis at L1-L2. Negative visualized abdominal viscera. Mild urinary bladder distension. No significant lumbar spinal stenosis. Age congruent lumbar disc degeneration. Multilevel moderate lumbar posterior element degeneration, severe at L4-L5 in greater on the left. IMPRESSION: 1. Acute to subacute central sacral fracture at S2-S3. Trace presacral fluid or edema. 2. No acute osseous abnormality in the lumbar spine. No significant lumbar spinal stenosis. Electronically Signed   By: Genevie Ann M.D.   On: 04/30/2015 16:12    ASSESSMENT AND PLAN:   Principal Problem:   Sacral fracture, closed ([place]) Active Problems:   Intractable low back pain  81 year old Caucasian female history of dementia presenting after mechanical fall with back pain  1. Intractable low back pain/sacral fracture: Provide pain medication, add bowel regimen, appreciated help by orthopedic surgery, consult physical therapy as well as social work Ortho suggested sacral kyphoplasty by neurosurgery.   Spoke ot pt's daughter - who is an Water engineer at [employer]- as per her- medicare does not cover for that type of surgery.   She has consulted some of her known doctors at [facility] and [employer] and lef message to social worker this morning- about   Doing pain control and discharge to rehab.   Will work on that- and likely discharge tomorrow.  2. Hypothyroidism unspecified Synthroid 3. Venous embolism prophylactic: Heparin subcutaneous  4. Urinary retention    Insert foley- and will start flomax- d/c with foley and let rehab decide after few days to take it out.  All the records are reviewed and case discussed with Care Management/Social Worker. Management plans discussed with the patient, family and they are in agreement.  CODE STATUS: full  TOTAL TIME TAKING CARE OF THIS PATIENT: 35 minutes.   POSSIBLE D/C IN 1-2 DAYS, DEPENDING ON CLINICAL CONDITION.   Vaughan Basta M.D on 05/02/2015   Between 7am to 6pm -  Pager -  [phone]  After 6pm go to [email or web address] - password [email or web address] [facility]  Office  [phone]  CC: Primary care physician; Idelle Crouch, MD  Note: This dictation was prepared with Dragon dictation along with smaller phrase technology. Any transcriptional errors that result from this process are unintentional.

## 2015-05-02 NOTE — Progress Notes (Signed)
Plan is for patient to D/C to [facility] tomorrow. Per Kim admissions coordinator at [facility] patient is going to room 202-B. RN will call report at [phone]. Patient's daughter Marcie Bal completed paper work at [employer] today. Clinical Education officer, museum (CSW) sent D/C summary to [employer] today. CSW will continue to follow and assist as needed.   Blima Rich, [place] [phone]

## 2015-05-02 NOTE — Progress Notes (Signed)
Patient has not voided since pulling  her foley out at 2320 last night. MD notified. Orders received to give a fluid bolus.

## 2015-05-02 NOTE — Progress Notes (Signed)
Foley cath clamped at 1715. Pt states she feels urge to void constantly.

## 2015-05-02 NOTE — Progress Notes (Signed)
Dr Lavetta Nielsen notified of pt pulling foley out. Order to leave foley out.

## 2015-05-02 NOTE — Progress Notes (Signed)
Subjective: No new complaints. Tolerated bed to chair with PT yesterday with difficulty.   Objective: Vital signs in last 24 hours: Temp:  [98 F (36.7 C)-99 F (37.2 C)] 98 F (36.7 C) (11/18 0739) Pulse Rate:  [73-96] 88 (11/18 0739) Resp:  [18] 18 (11/18 0739) BP: (132-178)/(59-77) 153/66 mmHg (11/18 0739) SpO2:  [92 %-100 %] 97 % (11/18 0739)  Intake/Output from previous day: 11/17 0701 - 11/18 0700 In: 320 [P.O.:320] Out: 775 [Urine:775] Intake/Output this shift:     Recent Labs  04/30/15 1655  HGB 10.8*    Recent Labs  04/30/15 1655  WBC 5.5  RBC 3.41*  HCT 33.0*  PLT 122*    Recent Labs  04/30/15 1655  NA 142  K 4.0  CL 108  CO2 27  BUN 16  CREATININE 0.73  GLUCOSE 101*  CALCIUM 8.4*   No results for input(s): LABPT, INR in the last 72 hours.  Physical Exam: Unchanged versus yesterday. NV intact to both LE's.  Assessment: Sacral insufficiency fractures S2 and S3.  Plan: I spoke with the patient's daughter yesterday. She wants to explore options for sacroplasty at either [facility] or [facility]. Once she finds someone willing/able to take on patient, she will let Levada Dy know so that appropriate transfer can be made. Meanwhile, she is to continue with pain control and limited mobilization with PT as tolerated. Thanks and please call if there  is anything else you need me for.   Marshall Cork Maykayla Highley 05/02/2015, 8:22 AM

## 2015-05-02 NOTE — Discharge Summary (Addendum)
[facility] [place] NAME: Tiffany Krause    MR#:  [id number]  DATE OF BIRTH:  Aug 16, 1920  DATE OF ADMISSION:  04/30/2015 ADMITTING PHYSICIAN: Lytle Butte, MD  DATE OF DISCHARGE: 05/03/15  PRIMARY CARE PHYSICIAN: Idelle Crouch, MD    ADMISSION DIAGNOSIS:  Fall [W19.XXXA] Closed fracture of eleventh thoracic vertebra, unspecified fracture morphology, initial encounter ([place]) [S22.089A]  DISCHARGE DIAGNOSIS:  Principal Problem:   Sacral fracture, closed ([place]) Active Problems:   Intractable low back pain   SECONDARY DIAGNOSIS:   Past Medical History  Diagnosis Date  . Thyroid disease   . Glaucoma   . Macular degeneration   . Dementia     HOSPITAL COURSE:   81 year old Caucasian female history of dementia presenting after mechanical fall with back pain  1. Intractable low back pain/sacral fracture: Provide pain medication, add bowel regimen, appreciated help by orthopedic surgery, consult physical therapy as well as social work Ortho suggested sacral kyphoplasty by neurosurgery.  Spoke to pt's daughter - who is an Water engineer at [employer]- as per her- medicare does not cover for that type of surgery.  She has consulted some of her known doctors at [facility] and [facility] and left message to social worker this morning- about Doing pain control and discharge to rehab.   2. Hypothyroidism- continue Synthroid 3. Venous embolism prophylactic: Heparin subcutaneous  4. Urinary retention  Insert foley- and started flomax- d/c with foley and let rehab decide after few days to take it out.  DISCHARGE CONDITIONS:   Stable.  CONSULTS OBTAINED:  Treatment Team:  Lytle Butte, MD Corky Mull, MD Hessie Knows, MD  DRUG ALLERGIES:   Allergies  Allergen Reactions  . Penicillins Other (See Comments)    Reaction:  Unknown   . Doxycycline Hyclate Nausea And Vomiting  . Erythromycin Other (See Comments)    Reaction:   Unknown   . Fosamax [Alendronate] Other (See Comments)    Reaction:  Unknown     DISCHARGE MEDICATIONS:   Current Discharge Medication List    START taking these medications   Details  calcium-vitamin D 250-100 MG-UNIT tablet Take 1 tablet by mouth 2 (two) times daily. Qty: 60 tablet, Refills: 0    docusate sodium (COLACE) 100 MG capsule Take 1 capsule (100 mg total) by mouth 2 (two) times daily. Qty: 10 capsule, Refills: 0    oxyCODONE-acetaminophen (PERCOCET) 5-325 MG tablet Take 2 tablets by mouth every 6 (six) hours as needed for moderate pain or severe pain. Qty: 30 tablet, Refills: 0    polyethylene glycol (MIRALAX / GLYCOLAX) packet Take 17 g by mouth daily as needed for mild constipation. Qty: 14 each, Refills: 0    tamsulosin (FLOMAX) 0.4 MG CAPS capsule Take 1 capsule (0.4 mg total) by mouth daily. Qty: 30 capsule, Refills: 0      CONTINUE these medications which have CHANGED   Details  traMADol (ULTRAM) 50 MG tablet Take 1 tablet (50 mg total) by mouth every 6 (six) hours as needed for moderate pain. Qty: 30 tablet, Refills: 0      CONTINUE these medications which have NOT CHANGED   Details  acetaminophen (TYLENOL) 325 MG tablet Take 650 mg by mouth 4 (four) times daily as needed for mild pain.    ibandronate (BONIVA) 150 MG tablet Take 150 mg by mouth every 30 (thirty) days. Pt takes on the 10th of every month.   Take in the morning with  a full glass of water, on an empty stomach, and do not take anything else by mouth or lie down for the next 30 min.    levothyroxine (SYNTHROID, LEVOTHROID) 88 MCG tablet Take 88 mcg by mouth daily before breakfast.    Multiple Vitamin (MULTIVITAMIN WITH MINERALS) TABS tablet Take 1 tablet by mouth daily.    Multiple Vitamins-Minerals (PRESERVISION AREDS 2) CAPS Take 1 capsule by mouth 2 (two) times daily.    timolol (TIMOPTIC-XR) 0.5 % ophthalmic gel-forming Place 1 drop into both eyes daily.          DISCHARGE  INSTRUCTIONS:    DUE TO URINARY RETENTION-  SENDING TO REHAB WITH FOLEY. STARTED ONPAIN CONTROL AND ON FLOMAX, SO AFTER FEW DAYS- PLEASE GIVE A TRIAL OF REMOVING CATHETER.  If you experience worsening of your admission symptoms, develop shortness of breath, life threatening emergency, suicidal or homicidal thoughts you must seek medical attention immediately by calling 911 or calling your MD immediately  if symptoms less severe.  You Must read complete instructions/literature along with all the possible adverse reactions/side effects for all the Medicines you take and that have been prescribed to you. Take any new Medicines after you have completely understood and accept all the possible adverse reactions/side effects.   Please note  You were cared for by a hospitalist during your hospital stay. If you have any questions about your discharge medications or the care you received while you were in the hospital after you are discharged, you can call the unit and asked to speak with the hospitalist on call if the hospitalist that took care of you is not available. Once you are discharged, your primary care physician will handle any further medical issues. Please note that NO REFILLS for any discharge medications will be authorized once you are discharged, as it is imperative that you return to your primary care physician (or establish a relationship with a primary care physician if you do not have one) for your aftercare needs so that they can reassess your need for medications and monitor your lab values.    Today   CHIEF COMPLAINT:   Chief Complaint  Patient presents with  . Back Pain    HISTORY OF PRESENT ILLNESS:  Tiffany Krause  is a 81 y.o. female with a known history of hypothyroidism unspecified who is presenting after mechanical fall and back pain Patient has history of dementia history aided by family members present at bedside. She sustained a mechanical fall approximately 4 days  ago. At that time she was evaluated in the emergency department, given pain medication and subsequently discharged with no acute findings. She followed up with orthopedic surgery for continued back pain approximately 2 days ago with minimal improvement. Given she was still complaining of pain now with decreased mobility she was advised present to the emergency department for further workup and evaluation. She is unable to further quantify/qualify her symptoms other than stated "I feel bad" emergency department course: MRI performed revealed acute/subacute fracture S2, S3 with chronic T11 compression fracture. She has received 3 doses of pain medications in the emergency department still complaining of pain   VITAL SIGNS:  Blood pressure 153/66, pulse 88, temperature 98 F (36.7 C), temperature source Oral, resp. rate 18, height 5\' 6"  (1.676 m), weight 60.374 kg (133 lb 1.6 oz), SpO2 97 %.  I/O:   Intake/Output Summary (Last 24 hours) at 05/02/15 1051 Last data filed at 05/02/15 0528  Gross per 24 hour  Intake  200 ml  Output    775 ml  Net   -575 ml    PHYSICAL EXAMINATION:   GENERAL: 81 y.o.-year-old patient lying in the bed with no acute distress.  EYES: Pupils equal, round, reactive to light and accommodation. No scleral icterus. Extraocular muscles intact.  HEENT: Head atraumatic, normocephalic. Oropharynx and nasopharynx clear.  NECK: Supple, no jugular venous distention. No thyroid enlargement, no tenderness.  LUNGS: Normal breath sounds bilaterally, no wheezing, rales,rhonchi or crepitation. No use of accessory muscles of respiration.  CARDIOVASCULAR: S1, S2 normal. No murmurs, rubs, or gallops.  ABDOMEN: Soft, suprapubic tender, nondistended. Bowel sounds present. No organomegaly or mass. will do foley EXTREMITIES: No pedal edema, cyanosis, or clubbing.  NEUROLOGIC: Cranial nerves II through XII are intact. Muscle strength 4/5 in all extremities. Sensation intact. Gait  not checked. Limites lower extrimities movement due to pain. PSYCHIATRIC: The patient is alert and oriented x 3.  SKIN: No obvious rash, lesion, or ulcer.   DATA REVIEW:   CBC  Recent Labs Lab 04/30/15 1655  WBC 5.5  HGB 10.8*  HCT 33.0*  PLT 122*    Chemistries   Recent Labs Lab 04/30/15 1655  NA 142  K 4.0  CL 108  CO2 27  GLUCOSE 101*  BUN 16  CREATININE 0.73  CALCIUM 8.4*  AST 36  ALT 23  ALKPHOS 66  BILITOT 1.0    Cardiac Enzymes  Recent Labs Lab 04/26/15 1849  TROPONINI <0.03    Microbiology Results  Results for orders placed or performed during the hospital encounter of 04/30/15  Surgical pcr screen     Status: None   Collection Time: 05/01/15  4:52 AM  Result Value Ref Range Status   MRSA, PCR NEGATIVE NEGATIVE Final   Staphylococcus aureus NEGATIVE NEGATIVE Final    Comment:        The Xpert SA Assay (FDA approved for NASAL specimens in patients over 83 years of age), is one component of a comprehensive surveillance program.  Test performance has been validated by [facility] for patients greater than or equal to 64 year old. It is not intended to diagnose infection nor to guide or monitor treatment.     RADIOLOGY:  Mr Thoracic Spine Wo Contrast  04/30/2015  CLINICAL DATA:  81 year old female who fell 4 days ago with continued pain. Age indeterminate T11 compression fracture seen radiographically. Subsequent encounter. EXAM: MRI THORACIC SPINE WITHOUT CONTRAST TECHNIQUE: Multiplanar, multisequence MR imaging of the thoracic spine was performed. No intravenous contrast was administered. COMPARISON:  Lumbar radiographs 04/26/2015. FINDINGS: Limited sagittal imaging of the cervical spine is unremarkable. Moderate compression of the T11 vertebral body re - demonstrated. No associated marrow edema. Mild retropulsion of the posterior superior endplate not resulting in significant spinal stenosis. Thoracic vertebral height and alignment  elsewhere within normal limits. No marrow edema or evidence of acute osseous abnormality. Capacious thoracic spinal canal. No spinal stenosis. Spinal cord signal is within normal limits at all visualized levels. Conus medullaris mostly visible at L1 and appears normal. Trace layering pleural effusions. Cardiomegaly. Ectatic thoracic aorta. Negative visualized upper abdominal viscera. IMPRESSION: 1. Chronic T11 compression fracture. No acute osseous abnormality in the thoracic spine. No significant spinal stenosis. 2. Trace bilateral pleural effusions. Electronically Signed   By: Genevie Ann M.D.   On: 04/30/2015 16:02   Mr Lumbar Spine Wo Contrast  04/30/2015  CLINICAL DATA:  81 year old female who fell 4 days ago with continued pain. Age indeterminate T11 compression fracture  seen radiographically. Subsequent encounter. EXAM: MRI LUMBAR SPINE WITHOUT CONTRAST TECHNIQUE: Multiplanar, multisequence MR imaging of the lumbar spine was performed. No intravenous contrast was administered. COMPARISON:  Lumbar radiographs 04/26/2015. Thoracic MRI from today reported separately. FINDINGS: Mild lumbar scoliosis. Chronic T11 compression fracture partially visible on these images, see thoracic study from today reported separately. Stable lumbar vertebral height and alignment, including mild grade 1 anterolisthesis at L4-L5. No lumbar marrow edema or evidence of acute osseous abnormality. However, the S2-S3 level of the sacrum is abnormal centrally. There is cortical disruption with surrounding mild marrow edema (series 4, image 10) and a small volume of presacral fluid or edema. Marrow edema also mildly tracks into the more lateral sacral ala on each side. The S1 and superior S2 levels appear to remain intact. Visualized lower thoracic spinal cord is normal with conus medularis at L1-L2. Negative visualized abdominal viscera. Mild urinary bladder distension. No significant lumbar spinal stenosis. Age congruent lumbar disc  degeneration. Multilevel moderate lumbar posterior element degeneration, severe at L4-L5 in greater on the left. IMPRESSION: 1. Acute to subacute central sacral fracture at S2-S3. Trace presacral fluid or edema. 2. No acute osseous abnormality in the lumbar spine. No significant lumbar spinal stenosis. Electronically Signed   By: Genevie Ann M.D.   On: 04/30/2015 16:12      Management plans discussed with the patient, family and they are in agreement.  CODE STATUS:     Code Status Orders        Start     Ordered   04/30/15 2055  Do not attempt resuscitation (DNR)   Continuous    Question Answer Comment  In the event of cardiac or respiratory ARREST Do not call a "code blue"   In the event of cardiac or respiratory ARREST Do not perform Intubation, CPR, defibrillation or ACLS   In the event of cardiac or respiratory ARREST Use medication by any route, position, wound care, and other measures to relive pain and suffering. May use oxygen, suction and manual treatment of airway obstruction as needed for comfort.      04/30/15 2054    Advance Directive Documentation        Most Recent Value   Type of Advance Directive  Healthcare Power of Attorney   Pre-existing out of facility DNR order (yellow form or pink MOST form)     "MOST" Form in Place?        TOTAL TIME TAKING CARE OF THIS PATIENT: 35 minutes.    Vaughan Basta M.D on 05/02/2015 at 10:51 AM  Between 7am to 6pm - Pager - [phone]  After 6pm go to [email or web address] - password [email or web address]  Office  [phone]  CC: Primary care physician; Idelle Crouch, MD   Note: This dictation was prepared with Dragon dictation along with smaller phrase technology. Any transcriptional errors that result from this process are unintentional.

## 2015-05-02 NOTE — Progress Notes (Signed)
Foley unclamped  For 30 minutes

## 2015-05-02 NOTE — Clinical Social Work Placement (Signed)
   CLINICAL SOCIAL WORK PLACEMENT  NOTE  Date:  05/02/2015  Patient Details  Name: DEYA BEADLE MRN: [id number] Date of Birth: [birth year]  Clinical Social Work is seeking post-discharge placement for this patient at the [place] level of care (*CSW will initial, date and re-position this form in  chart as items are completed):  Yes   Patient/family provided with [facility] Work Department's list of facilities offering this level of care within the geographic area requested by the patient (or if unable, by the patient's family).  Yes   Patient/family informed of their freedom to choose among providers that offer the needed level of care, that participate in Medicare, Medicaid or managed care program needed by the patient, have an available bed and are willing to accept the patient.  Yes   Patient/family informed of [facility]'s ownership interest in [facility] and [facility], as well as of the fact that they are under no obligation to receive care at these facilities.  PASRR submitted to EDS on 05/01/15     PASRR number received on 05/02/15     Existing PASRR number confirmed on       FL2 transmitted to all facilities in geographic area requested by pt/family on 05/01/15     FL2 transmitted to all facilities within larger geographic area on       Patient informed that his/her managed care company has contracts with or will negotiate with certain facilities, including the following:        Yes   Patient/family informed of bed offers received.  Patient chooses bed at  [facility] )     Physician recommends and patient chooses bed at      Patient to be transferred to   on  .  Patient to be transferred to facility by       Patient family notified on   of transfer.  Name of family member notified:        PHYSICIAN Please sign FL2     Additional Comment:    _______________________________________________ Loralyn Freshwater, LCSW 05/02/2015, 3:30 PM

## 2015-05-02 NOTE — Discharge Instructions (Signed)
Vertebral Fracture °A vertebral fracture is a break in one of the bones that make up the spine (vertebrae). The vertebrae are stacked on top of each other to form the spinal column. They support the body and protect the spinal cord. The vertebral column has an upper part (cervical spine), a middle part (thoracic spine), and a lower part (lumbar spine). Most vertebral fractures occur in the thoracic spine or lumbar spine. °There are three main types of vertebral fractures: °· Flexion fracture. This happens when vertebrae collapse. Vertebrae can collapse: °¨ In the front (compression fracture). This type of fracture is common in people who have a condition that causes their bones to be weak and brittle (osteoporosis). The fracture can make a person lose height. °¨ In the front and back (axial burst fracture). °· Extension fracture. This happens when an external force pulls apart the vertebrae. °· Rotation fracture. This happens when the spine bends extremely in one direction. This type can cause a piece of a vertebra to break off (transverse process fracture) or move out of its normal position (fracture dislocation). This type of fracture has a high risk for spinal cord injury. °Vertebral fractures can range from mild to very severe. The most severe types are those that cause the broken bones to move out of place (unstable) and those that injure or press on the spinal cord. °CAUSES °This condition is usually caused by a forceful injury. This type of injury commonly results from: °· Car accidents. °· Falling or jumping from a great height. °· Collisions in contact sports. °· Violent acts, such as an assault or a gunshot wound. °RISK FACTORS °This injury is more likely to happen to people who: °· Have osteoporosis. °· Participate in contact sports. °· Are in situations that could result in falls or other violent injuries. °SYMPTOMS °Symptoms of this injury depend on the location and the type of fracture. The most common  symptom is back pain that gets worse with movement. You may also have trouble standing or walking. If a fracture has damaged your spinal cord or is pressing on it, you may also have: °· Numbness. °· Tingling. °· Weakness. °· Loss of movement. °· Loss of bowel or bladder control. °DIAGNOSIS °This injury may be diagnosed based on symptoms, medical history, and a physical exam. You may also have imaging tests to confirm the diagnosis. These may include: °· Spine X-ray. °· CT scan. °· MRI. °TREATMENT °Treatment for this injury depends on the type of fracture. If your fracture is stable and does not affect your spinal cord, it may heal with nonsurgical treatment, such as: °· Taking pain medicine. °· Wearing a cast or a brace. °· Doing physical therapy exercises. °If your vertebral fracture is unstable or it affects your spinal cord, you may need surgical treatment, such as: °· Laminectomy. This procedure involves removing the part of a vertebra that is pushing on the spinal cord (spinal decompression surgery). Bone fragments may also be removed. °· Spinal fusion. This procedure is used to stabilize an unstable fracture. Vertebrae may be joined together with a piece of bone from another part of your body (graft) and held in place with rods, plates, or screws. °· Vertebroplasty. In this procedure, bone cement is used to rebuild collapsed vertebrae. °HOME CARE INSTRUCTIONS °General Instructions °· Take medicines only as directed by your health care provider. °· Do not drive or operate heavy machinery while taking pain medicine. °· If directed, apply ice to the injured area: °¨ Put   ice in a plastic bag.  Place a towel between your skin and the bag.  Leave the ice on for 30 minutes every two hours at first. Then apply the ice as needed.  Wear your neck brace or back brace as directed by your health care provider.  Do not drink alcohol. Alcohol can interfere with your treatment.  Keep all follow-up visits as directed  by your health care provider. This is important. It can help to prevent permanent injury, disability, and long-lasting (chronic) pain. Activity  Stay in bed (on bed rest) only as directed by your health care provider. Being on bed rest for too long can make your condition worse.  Return to your normal activities as directed by your health care provider. Ask what activities are safe for you.  Do exercises to improve motion and strength in your back (physical therapy), as recommended by your health care provider.   Exercise regularly as directed by your health care provider. SEEK MEDICAL CARE IF:  You have a fever.  You develop a cough that makes your pain worse.  Your pain medicine is not helping.  Your pain does not get better over time.  You cannot return to your normal activities as planned or expected. SEEK IMMEDIATE MEDICAL CARE IF:  Your pain is very bad and it suddenly gets worse.  You are unable to move any body part (paralysis) that is below the level of your injury.  You have numbness, tingling, or weakness in any body part that is below the level of your injury.  You cannot control your bladder or bowels.   This information is not intended to replace advice given to you by your health care provider. Make sure you discuss any questions you have with your health care provider.   Document Released: 07/08/2004 Document Revised: 10/15/2014 Document Reviewed: 06/05/2014 Elsevier Interactive Patient Education 2016 [employer]  DISCHARGING [place]. STARTED ON FLOMAX  AND BETTER PAIN CONTROL. PLEASE GIVE A TRIAL OF REMOVING FOLEY AFTER FEW DAYS.

## 2015-05-02 NOTE — Progress Notes (Deleted)
Paged MD. Lab results positive for c. difficile  and fecal occult blood. Notified MD of results. No new orders at this time.

## 2015-05-02 NOTE — Progress Notes (Signed)
Physical Therapy Treatment Patient Details Name: Tiffany Krause MRN: [id number] DOB: [birth year] Today's Date: 05/02/2015    History of Present Illness Pt was admitted to the hospital after a fall which revealed sacral fractures of S2 and S3.     PT Comments    Pt willing to participate in therapeutic exercise this morning, however it is noted that pt is c/o pain more today than yesterday. Pt unable to maintain sitting balance secondary to this pain. Nursing was notified. Although she has strength, mobility deficits and acute pain, she is still cooperative and will continue to benefit from skilled PT in order to return to optimal PLOF. PT to continue therex and OOB mobility as tolerated by pt.   Follow Up Recommendations  SNF     Equipment Recommendations  Rolling walker with 5" wheels    Recommendations for Other Services       Precautions / Restrictions Precautions Precautions: Fall Restrictions Weight Bearing Restrictions: No (limited ambulation)    Mobility  Bed Mobility Overal bed mobility: Needs Assistance Bed Mobility: Supine to Sit     Supine to sit: Mod assist     General bed mobility comments:  (significant c/o pain with bed mobility this morning)  Transfers Overall transfer level:  (not attempted secondary to pain)                  Ambulation/Gait Ambulation/Gait assistance:  (Not appropriate secondary to pain)               Stairs            Wheelchair Mobility    Modified Rankin (Stroke Patients Only)       Balance Overall balance assessment: History of Falls                                  Cognition Arousal/Alertness: Awake/alert Behavior During Therapy: Restless (secondary to pain) Overall Cognitive Status: No family/caregiver present to determine baseline cognitive functioning                      Exercises Other Exercises Other Exercises: Pt able to perform bilateral therex x 10 reps at  min A for proper technique. Needs cues to continue to attend, but is able to do so. Also slightly limited by pain. Exercises performed: ankle pumps, heel slides, SAQ, and hip abd Other Exercises: Sat EOB x 1 min but further mobility deferred secondary to pt not being able to hold sitting balance due to pain in low back. Nursing notified of current pain and mobility    General Comments        Pertinent Vitals/Pain Pain Assessment:  (c/o LBP - does not quantify )    Home Living                      Prior Function            PT Goals (current goals can now be found in the care plan section) Acute Rehab PT Goals Patient Stated Goal: none stated PT Goal Formulation: With patient Time For Goal Achievement: 05/15/15 Potential to Achieve Goals: Fair Progress towards PT goals: Progressing toward goals    Frequency  Min 2X/week    PT Plan Current plan remains appropriate    Co-evaluation             End of Session Equipment Utilized During Treatment:  Gait belt Activity Tolerance: Patient limited by pain Patient left: in bed;with call bell/phone within reach;with bed alarm set     Time: [id number] PT Time Calculation (min) (ACUTE ONLY): 10 min  Charges:                       G CodesJanyth Contes 2015-05-27, 11:02 AM  Janyth Contes, SPT. [phone]

## 2015-05-02 NOTE — Progress Notes (Signed)
Paged MD. Madaline Brilliant to attempt bladder training for next 24 hours, see care order. No IV access d/t pt removing it, per MD ok to hold off on IV access since pt is planned to d/c tomorrow. Ok to d/c foley cath within 24 hours, pt on Flomax, voiding trial is appropriate.

## 2015-05-02 NOTE — Progress Notes (Signed)
Notified Dr. Anselm Jungling that [facility] called and states that the prescription that sent for Calcium-vitamin D 250-10 0mg  does not exist. Dr. Anselm Jungling to send me prescription [facility] to [facility].

## 2015-05-03 LAB — URINALYSIS COMPLETE WITH MICROSCOPIC (ARMC ONLY)
BILIRUBIN URINE: NEGATIVE
Glucose, UA: NEGATIVE mg/dL
KETONES UR: NEGATIVE mg/dL
LEUKOCYTES UA: NEGATIVE
NITRITE: NEGATIVE
PH: 6 (ref 5.0–8.0)
PROTEIN: NEGATIVE mg/dL
SPECIFIC GRAVITY, URINE: 1.004 — AB (ref 1.005–1.030)
Squamous Epithelial / LPF: NONE SEEN

## 2015-05-03 NOTE — Progress Notes (Signed)
[facility] [place] NAME: Tiffany Krause    MR#:  [id number]  DATE OF BIRTH:  11/28/1920  SUBJECTIVE:  CHIEF COMPLAINT:   Chief Complaint  Patient presents with  . Back Pain  came with fall, and have sacral fracture, pain better controlled- she was agitated last night.Pulled out her foley- this morning again had 600 ml Urine retention, so foley placed again.  REVIEW OF SYSTEMS:  CONSTITUTIONAL: No fever, fatigue or weakness.  EYES: No blurred or double vision.  EARS, NOSE, AND THROAT: No tinnitus or ear pain.  RESPIRATORY: No cough, shortness of breath, wheezing or hemoptysis.  CARDIOVASCULAR: No chest pain, orthopnea, edema.  GASTROINTESTINAL: No nausea, vomiting, diarrhea , positive for lower abdominal pain- due to bladder.  GENITOURINARY: No dysuria, hematuria.  ENDOCRINE: No polyuria, nocturia,  HEMATOLOGY: No anemia, easy bruising or bleeding SKIN: No rash or lesion. MUSCULOSKELETAL: lower back joint pain or arthritis.   NEUROLOGIC: No tingling, numbness, weakness.  PSYCHIATRY: No anxiety or depression.   ROS  DRUG ALLERGIES:   Allergies  Allergen Reactions  . Penicillins Other (See Comments)    Reaction:  Unknown   . Doxycycline Hyclate Nausea And Vomiting  . Erythromycin Other (See Comments)    Reaction:  Unknown   . Fosamax [Alendronate] Other (See Comments)    Reaction:  Unknown     VITALS:  Blood pressure 150/62, pulse 77, temperature 97.6 F (36.4 C), temperature source Oral, resp. rate 18, height 5\' 6"  (1.676 m), weight 60.374 kg (133 lb 1.6 oz), SpO2 93 %.  PHYSICAL EXAMINATION:  GENERAL:  81 y.o.-year-old patient lying in the bed with no acute distress.  EYES: Pupils equal, round, reactive to light and accommodation. No scleral icterus. Extraocular muscles intact.  HEENT: Head atraumatic, normocephalic. Oropharynx and nasopharynx clear.  NECK:  Supple, no jugular venous distention. No thyroid  enlargement, no tenderness.  LUNGS: Normal breath sounds bilaterally, no wheezing, rales,rhonchi or crepitation. No use of accessory muscles of respiration.  CARDIOVASCULAR: S1, S2 normal. No murmurs, rubs, or gallops.  ABDOMEN: Soft, suprapubic tender, nondistended. Bowel sounds present. No organomegaly or mass. Foley in place. EXTREMITIES: No pedal edema, cyanosis, or clubbing.  NEUROLOGIC: Cranial nerves II through XII are intact. Muscle strength 4/5 in all extremities. Sensation intact. Gait not checked. Limites lower extrimities movement due to pain. PSYCHIATRIC: The patient is alert and oriented x 2.  SKIN: No obvious rash, lesion, or ulcer.   Physical Exam LABORATORY PANEL:   CBC  Recent Labs Lab 04/30/15 1655  WBC 5.5  HGB 10.8*  HCT 33.0*  PLT 122*   ------------------------------------------------------------------------------------------------------------------  Chemistries   Recent Labs Lab 04/30/15 1655  NA 142  K 4.0  CL 108  CO2 27  GLUCOSE 101*  BUN 16  CREATININE 0.73  CALCIUM 8.4*  AST 36  ALT 23  ALKPHOS 66  BILITOT 1.0   ------------------------------------------------------------------------------------------------------------------  Cardiac Enzymes  Recent Labs Lab 04/26/15 1849  TROPONINI <0.03   ------------------------------------------------------------------------------------------------------------------  RADIOLOGY:  No results found.  ASSESSMENT AND PLAN:   Principal Problem:   Sacral fracture, closed ([place]) Active Problems:   Intractable low back pain  81 year old Caucasian female history of dementia history of dementia presenting after mechanical fall with back pain  1. Intractable low back pain/sacral fracture: Provide pain medication, add bowel regimen, appreciated help by orthopedic surgery, consult physical therapy as well as social work Ortho suggested sacral kyphoplasty by neurosurgery.   Spoke ot pt's daughter - who is  an Ob G[employer]n  doctor at [facility]- as per her- medicare does not cover for that type of surgery.   She has consulted some of her known doctors at [facility] and [facility] and lef message to social worker this morning- about   Doing pain control and discharge to rehab.   Pain better controlleld with tramadol for moderate and Percocet for severe pain.  2. Hypothyroidism- contd Synthroid 3. Venous embolism prophylactic: Heparin subcutaneous  4. Urinary retention    Insert foley- and will start flomax- d/c with foley and let rehab decide after few days to take it out.  I spoke to her daughter- and she is aware about this- she will follow with rehab.  All the records are reviewed and case discussed with Care Management/Social Worker. Management plans discussed with the patient, family and they are in agreement.  CODE STATUS: full  TOTAL TIME TAKING CARE OF THIS PATIENT: 35 minutes.   D/c today.  Vaughan Basta M.D on 05/03/2015   Between 7am to 6pm - Pager - [phone]  After 6pm go to [email or web address] - password [email or web address] [facility]  Office  [phone]  CC: Primary care physician; Idelle Crouch, MD  Note: This dictation was prepared with Dragon dictation along with smaller phrase technology. Any transcriptional errors that result from this process are unintentional.

## 2015-05-03 NOTE — Progress Notes (Addendum)
Paged Dr. Marthann Schiller to request in/out cath. Bladder scan 617cc. Dr ordered indwelling cath. Order placed.

## 2015-05-03 NOTE — Clinical Social Work Placement (Signed)
   CLINICAL SOCIAL WORK PLACEMENT  NOTE  Date:  05/03/2015  Patient Details  Name: Tiffany Krause MRN: [id number] Date of Birth: 1920/10/08  Clinical Social Work is seeking post-discharge placement for this patient at the [place] level of care (*CSW will initial, date and re-position this form in  chart as items are completed):  Yes   Patient/family provided with [place] Work Department's list of facilities offering this level of care within the geographic area requested by the patient (or if unable, by the patient's family).  Yes   Patient/family informed of their freedom to choose among providers that offer the needed level of care, that participate in Medicare, Medicaid or managed care program needed by the patient, have an available bed and are willing to accept the patient.  Yes   Patient/family informed of [facility]'s ownership interest in [facility] and [facility], as well as of the fact that they are under no obligation to receive care at these facilities.  PASRR submitted to EDS on 05/01/15     PASRR number received on 05/02/15     Existing PASRR number confirmed on       FL2 transmitted to all facilities in geographic area requested by pt/family on 05/01/15     FL2 transmitted to all facilities within larger geographic area on       Patient informed that his/her managed care company has contracts with or will negotiate with certain facilities, including the following:        Yes   Patient/family informed of bed offers received.  Patient chooses bed at  [facility] )     Physician recommends and patient chooses bed at      Patient to be transferred to  [facility]) on 05/03/15.  Patient to be transferred to facility by  (EMS)     Patient family notified on 05/03/15 of transfer.  Name of family member notified:   (Daughter Marcie Bal )     PHYSICIAN Please sign FL2     Additional Comment:     _______________________________________________ Maurine Cane, LCSW 05/03/2015, 1:01 PM

## 2015-05-03 NOTE — Progress Notes (Signed)
Clinical Social Worker informed by Vaughan Basta, MD that patient is medically ready to discharge to [facility], Patient and Patient's daughter are in a agreement with plan.  [facility] confirmed that patient's bed is ready. Provided patient's room number 202-B and number to call for report [phone] . All discharge information faxed to  Facility.  RN will call report and patient will discharge to [facility] via EMS.  Casimer Lanius. Latanya Presser, MSW Clinical Social Work Department [phone] 1:04 PM

## 2015-05-03 NOTE — Progress Notes (Signed)
Report called to Gregary Signs at [facility]. Patient's IV removed, belongings packed & EMS called. VSS at time of discharge.

## 2015-05-04 LAB — URINALYSIS COMPLETE WITH MICROSCOPIC (ARMC ONLY)
Bilirubin Urine: NEGATIVE
Glucose, UA: NEGATIVE mg/dL
Ketones, ur: NEGATIVE mg/dL
Nitrite: POSITIVE — AB
PROTEIN: 30 mg/dL — AB
SPECIFIC GRAVITY, URINE: 1.027 (ref 1.005–1.030)
SQUAMOUS EPITHELIAL / LPF: NONE SEEN
pH: 5 (ref 5.0–8.0)

## 2015-05-06 LAB — URINE CULTURE

## 2015-05-12 ENCOUNTER — Other Ambulatory Visit
Admission: RE | Admit: 2015-05-12 | Discharge: 2015-05-12 | Disposition: A | Discharge status: Home / Self Care | Payer: Medicare Other | Source: Ambulatory Visit | Attending: Internal Medicine | Admitting: Internal Medicine

## 2015-05-12 DIAGNOSIS — R410 Disorientation, unspecified: Secondary | ICD-10-CM | POA: Diagnosis present

## 2015-05-12 LAB — URINALYSIS COMPLETE WITH MICROSCOPIC (ARMC ONLY)
BILIRUBIN URINE: NEGATIVE
Bacteria, UA: NONE SEEN
GLUCOSE, UA: NEGATIVE mg/dL
Hgb urine dipstick: NEGATIVE
Leukocytes, UA: NEGATIVE
Nitrite: NEGATIVE
PH: 7 (ref 5.0–8.0)
Protein, ur: NEGATIVE mg/dL
Specific Gravity, Urine: 1.012 (ref 1.005–1.030)

## 2015-05-13 ENCOUNTER — Ambulatory Visit: Payer: Self-pay | Admitting: Urology

## 2015-05-14 ENCOUNTER — Encounter: Payer: Self-pay | Admitting: Urology

## 2015-05-14 ENCOUNTER — Ambulatory Visit (INDEPENDENT_AMBULATORY_CARE_PROVIDER_SITE_OTHER): Payer: Medicare Other | Admitting: Urology

## 2015-05-14 VITALS — BP 99/63 | HR 74 | Ht 63.0 in | Wt 125.7 lb

## 2015-05-14 DIAGNOSIS — M810 Age-related osteoporosis without current pathological fracture: Secondary | ICD-10-CM

## 2015-05-14 DIAGNOSIS — M8580 Other specified disorders of bone density and structure, unspecified site: Secondary | ICD-10-CM

## 2015-05-14 DIAGNOSIS — N952 Postmenopausal atrophic vaginitis: Secondary | ICD-10-CM | POA: Diagnosis not present

## 2015-05-14 DIAGNOSIS — E039 Hypothyroidism, unspecified: Secondary | ICD-10-CM

## 2015-05-14 DIAGNOSIS — K635 Polyp of colon: Secondary | ICD-10-CM

## 2015-05-14 DIAGNOSIS — N3010 Interstitial cystitis (chronic) without hematuria: Secondary | ICD-10-CM

## 2015-05-14 DIAGNOSIS — R339 Retention of urine, unspecified: Secondary | ICD-10-CM

## 2015-05-14 DIAGNOSIS — H3530 Unspecified macular degeneration: Secondary | ICD-10-CM

## 2015-05-14 DIAGNOSIS — C4490 Unspecified malignant neoplasm of skin, unspecified: Secondary | ICD-10-CM

## 2015-05-14 DIAGNOSIS — E785 Hyperlipidemia, unspecified: Secondary | ICD-10-CM

## 2015-05-14 HISTORY — DX: Interstitial cystitis (chronic) without hematuria: N30.10

## 2015-05-14 HISTORY — DX: Unspecified macular degeneration: H35.30

## 2015-05-14 HISTORY — DX: Unspecified malignant neoplasm of skin, unspecified: C44.90

## 2015-05-14 HISTORY — DX: Hyperlipidemia, unspecified: E78.5

## 2015-05-14 HISTORY — DX: Polyp of colon: K63.5

## 2015-05-14 HISTORY — DX: Age-related osteoporosis without current pathological fracture: M81.0

## 2015-05-14 HISTORY — DX: Hypothyroidism, unspecified: E03.9

## 2015-05-14 HISTORY — DX: Other specified disorders of bone density and structure, unspecified site: M85.80

## 2015-05-14 LAB — URINE CULTURE: CULTURE: NO GROWTH

## 2015-05-14 LAB — BLADDER SCAN AMB NON-IMAGING

## 2015-05-14 MED ORDER — ESTROGENS, CONJUGATED 0.625 MG/GM VA CREA: 1.0000 | TOPICAL_CREAM | Freq: Every day | VAGINAL | Status: AC

## 2015-05-14 NOTE — Progress Notes (Signed)
05/14/2015 11:33 AM   Tiffany Krause [birth year] Tiffany Krause  Referring provider: Idelle Crouch, MD [facility] [place], [place] [address]  Chief Complaint  Patient presents with  . Urinary Retention    nEW pATIENT    HPI: Patient is a 81 year old white female who developed urinary retention after a sacral caudal erections suffered on 04/26/2015.  Patient's daughter states that prior to this event, the patient had been voiding well on her own.  She did undergo voiding trials in the hospital but they were not successful.  She did have a traumatic Foley removal while in the hospital as well. She had been started on tamsulosin during her hospital admission. It has not helped in emptying her bladder.  Currently, she is being straight cathed every 8 hours while she is at [facility]. Her residuals have been anywhere from 200 to 500 on occasions.  She had seen Dr. Bernardo Heater in the past for evaluation of pelvic pain.  He did not discover a urological reason for her pelvic pain.  She did have a positive urine culture for Escherichia coli 05/04/2015. She was placed on Bactrim. A repeat culture is still pending at this time.  There is not been any blood in her urine.  The patient's daughter and caregivers would like to bring the patient home. The fact that she is now voiding on her own is the limiting factor to being discharged to home.  She has not had any recent fevers, chills, nausea or vomiting.     PMH: Past Medical History  Diagnosis Date  . Thyroid disease   . Glaucoma   . Macular degeneration   . Dementia   . MI (mitral incompetence) 03/10/2011  . Colon polyp 05/14/2015  . Adult hypothyroidism 05/14/2015    Overview:  Multinodular goiter.   . Sacral fracture, closed ([place]) 04/30/2015  . Osteopenia 05/14/2015  . Osteoporosis, post-menopausal 05/14/2015  . CA of skin 05/14/2015    Overview:  PREVIOUS   . Stress fracture of sacrum 05/02/2015  . Chronic  interstitial cystitis 05/14/2015  . 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 Extensive workup in 2012 - negative cystoscopy, treated UTI, treated with Vesicare (nausea), Uribel, Pyridium, Detrol - all with no relief of symptoms. Per [facility] she is diagnosed with interstitial cystitis though patient is not aware of that diagnosis.   . Intractable low back pain 04/30/2015  . HLD (hyperlipidemia) 05/14/2015  . Degeneration macular 05/14/2015    Surgical History: Past Surgical History  Procedure Laterality Date  . Cataract extraction    . Cholecystectomy  2003    Home Medications:    Medication List       This list is accurate as of: 05/14/15 11:33 AM.  Always use your most recent med list.               acetaminophen 325 MG tablet  Commonly known as:  TYLENOL  Take 650 mg by mouth 4 (four) times daily as needed for mild pain.     bisacodyl 10 MG suppository  Commonly known as:  DULCOLAX  Place 10 mg rectally as needed for moderate constipation.     calcium-vitamin D 250-100 MG-UNIT tablet  Take 1 tablet by mouth 2 (two) times daily.     conjugated estrogens vaginal cream  Commonly known as:  PREMARIN  Place 1 Applicatorful vaginally daily. Apply 0.5mg  (pea-sized amount)  just inside the vaginal introitus with a finger-tip every night for two weeks and then Monday, Wednesday and Friday nights.     diclofenac 1.3 % Ptch  Commonly known as:  FLECTOR  Place 1 patch onto the skin 2 (two) times daily.     docusate sodium 100 MG capsule  Commonly known as:  COLACE  Take 1 capsule (100 mg total) by mouth 2 (two) times daily.     ibandronate 150 MG tablet  Commonly known as:  BONIVA  Take 150 mg by mouth every 30 (thirty) days. Pt takes on the 10th of every month.   Take in the morning with a full glass of water, on an empty stomach, and do not take anything else by mouth or lie down for  the next 30 min.     levothyroxine 88 MCG tablet  Commonly known as:  SYNTHROID, LEVOTHROID  Take 88 mcg by mouth daily before breakfast.     magnesium citrate Soln  Take 1 Bottle by mouth as needed for severe constipation.     multivitamin with minerals Tabs tablet  Take 1 tablet by mouth daily.     oxyCODONE-acetaminophen 5-325 MG tablet  Commonly known as:  PERCOCET  Take 2 tablets by mouth every 6 (six) hours as needed for moderate pain or severe pain.     polyethylene glycol packet  Commonly known as:  MIRALAX / GLYCOLAX  Take 17 g by mouth daily as needed for mild constipation.     PRESERVISION AREDS 2 Caps  Take 1 capsule by mouth 2 (two) times daily.     tamsulosin 0.4 MG Caps capsule  Commonly known as:  FLOMAX  Take 1 capsule (0.4 mg total) by mouth daily.     timolol 0.5 % ophthalmic gel-forming  Commonly known as:  TIMOPTIC-XR  Place 1 drop into both eyes daily.     traMADol 50 MG tablet  Commonly known as:  ULTRAM  Take 1 tablet (50 mg total) by mouth every 6 (six) hours as needed for moderate pain.        Allergies:  Allergies  Allergen Reactions  . Penicillins Other (See Comments)    Reaction:  Unknown   . Doxycycline Hyclate Nausea And Vomiting  . Erythromycin Other (See Comments)    Reaction:  Unknown   . Fosamax [Alendronate] Other (See Comments)    Reaction:  Unknown     Family History: Family History  Problem Relation Age of Onset  . Diabetes Neg Hx   . Bladder Cancer Neg Hx   . Prostate cancer Neg Hx   . Kidney cancer Neg Hx     Social History:  reports that she has never smoked. She does not have any smokeless tobacco history on file. She reports that she drinks about 0.6 oz of alcohol per week. She reports that she does not use illicit drugs.  ROS: UROLOGY Frequent Urination?: No Hard to postpone urination?: No Burning/pain with urination?: No Get up at night to urinate?: No Leakage of urine?: No Urine stream starts and  stops?: No Trouble starting stream?: Yes Do you have to strain to urinate?: No Blood in urine?: No Urinary tract infection?: Yes Sexually transmitted disease?: No Injury to kidneys or bladder?: No Painful intercourse?: No Weak stream?: No Currently pregnant?: No Vaginal bleeding?: No Last menstrual period?: n  Gastrointestinal Nausea?: No Vomiting?: No Indigestion/heartburn?: No Diarrhea?: No Constipation?: Yes  Constitutional Fever: No Night sweats?: No Weight loss?: No Fatigue?: No  Skin Skin rash/lesions?: No Itching?: No  Eyes Blurred vision?: No Double vision?: No  Ears/Nose/Throat Sore throat?: No Sinus problems?: No  Hematologic/Lymphatic Swollen glands?: No Easy bruising?: No  Cardiovascular Leg swelling?: No Chest pain?: No  Respiratory Cough?: No Shortness of breath?: No  Endocrine Excessive thirst?: No  Musculoskeletal Back pain?: No Joint pain?: No  Neurological Headaches?: No Dizziness?: No  Psychologic Depression?: No Anxiety?: No  Physical Exam: BP 99/63 mmHg  Pulse 74  Ht 5\' 3"  (1.6 m)  Wt 125 lb 11.2 oz (57.017 kg)  BMI 22.27 kg/m2  Constitutional: Well nourished. Alert and oriented, No acute distress. HEENT: [place] AT, moist mucus membranes. Trachea midline, no masses. Cardiovascular: No clubbing, cyanosis, or edema. Respiratory: Normal respiratory effort, no increased work of breathing. GI: Abdomen is soft, non tender, non distended, no abdominal masses. Liver and spleen not palpable.  No hernias appreciated.  Stool sample for occult testing is not indicated.   GU: No CVA tenderness.  No bladder fullness or masses.  Atrophic external genitalia, scant pubic hair distribution, no lesions.  Dilated urethral meatus, no lesions, no prolapse, no discharge.   No urethral masses and/or tenderness. No bladder fullness, tenderness or masses. Atrophic vagina mucosa, poor estrogen effect, no discharge, no lesions, vaginal canal is  extremely narrowed.  I could only get one finger into the vault.  I could not perform a bimanual exam.    A 5 mm x 5 mm is located at the base of the vaginal vault.   Skin: No rashes, bruises or suspicious lesions. Lymph: No cervical or inguinal adenopathy. Neurologic: Grossly intact, no focal deficits, moving all 4 extremities. Psychiatric: Normal mood and affect.  Laboratory Data: Lab Results  Component Value Date   WBC 5.5 04/30/2015   HGB 10.8* 04/30/2015   HCT 33.0* 04/30/2015   MCV 96.6 04/30/2015   PLT 122* 04/30/2015    Lab Results  Component Value Date   CREATININE 0.73 04/30/2015   Pertinent imaging Results for Tiffany, Krause (MRN [id number]) as of 05/14/2015 11:26  Ref. Range 05/14/2015 09:59  Scan Result Unknown 427ml   Procedure Patient was cleaned and prepped in a sterile fashion.   A 14 FR and urine return was noted 440  ml, urine was dark yellow  in color. Patient tolerated well, no complications were noted Patient was given a sample bag with supplies to take home.  Instructions were given per me for patient to cath three times daily.  An order was placed with [facility] for catheters to be sent to the patient's home. Patient is to follow up in one month.  Preformed by: Zara Council, P.A-C   Assessment & Plan:    1. Urinary retention:   Patient has been experiencing urinary retention since her sacral compression suffered 2 weeks ago. It is currently being managed by CIC at her rehabilitation facility.  The patient's daughter and caregivers would like to continue this regimen at home.  The daughter will contact her rehabilitation facility to see if they would instruct the patient's caregivers on how to catheterize the patient.  I advised the patient's daughter and caregiver to continue with CIC every 8 hours to keep her residuals at 200 cc or below.  They will return in one month.  - BLADDER SCAN AMB NON-IMAGING  2. Atrophic vaginitis:   Patient had been on  vaginal estrogen cream in the past. We will reinstate this prescription today.  I have given her samples of Premarin cream.  And have sent a prescription to the pharmacy.  They will return in one month.     Return in about 1 month (around 06/13/2015) for exam.  Zara Council, [facility]  [facility] [address] [place], [place] [address] [phone]

## 2015-05-15 ENCOUNTER — Encounter
Admission: RE | Admit: 2015-05-15 | Discharge: 2015-05-15 | Disposition: A | Discharge status: Home / Self Care | Payer: Medicare Other | Source: Ambulatory Visit | Attending: Internal Medicine | Admitting: Internal Medicine

## 2015-05-16 ENCOUNTER — Emergency Department
Admission: EM | Admit: 2015-05-16 | Discharge: 2015-05-16 | Disposition: A | Discharge status: Home / Self Care | Payer: Medicare Other | Attending: Emergency Medicine | Admitting: Emergency Medicine

## 2015-05-16 ENCOUNTER — Encounter: Payer: Self-pay | Admitting: Emergency Medicine

## 2015-05-16 DIAGNOSIS — G8929 Other chronic pain: Secondary | ICD-10-CM | POA: Insufficient documentation

## 2015-05-16 DIAGNOSIS — Z79899 Other long term (current) drug therapy: Secondary | ICD-10-CM | POA: Insufficient documentation

## 2015-05-16 DIAGNOSIS — R339 Retention of urine, unspecified: Secondary | ICD-10-CM | POA: Diagnosis present

## 2015-05-16 DIAGNOSIS — Z791 Long term (current) use of non-steroidal anti-inflammatories (NSAID): Secondary | ICD-10-CM | POA: Diagnosis not present

## 2015-05-16 DIAGNOSIS — Z880 Allergy status to penicillin: Secondary | ICD-10-CM | POA: Insufficient documentation

## 2015-05-16 DIAGNOSIS — F0390 Unspecified dementia without behavioral disturbance: Secondary | ICD-10-CM | POA: Diagnosis not present

## 2015-05-16 DIAGNOSIS — M549 Dorsalgia, unspecified: Secondary | ICD-10-CM | POA: Diagnosis not present

## 2015-05-16 LAB — URINALYSIS COMPLETE WITH MICROSCOPIC (ARMC ONLY)
BACTERIA UA: NONE SEEN
BILIRUBIN URINE: NEGATIVE
Glucose, UA: NEGATIVE mg/dL
KETONES UR: NEGATIVE mg/dL
Leukocytes, UA: NEGATIVE
NITRITE: NEGATIVE
PH: 5 (ref 5.0–8.0)
PROTEIN: NEGATIVE mg/dL
SPECIFIC GRAVITY, URINE: 1.015 (ref 1.005–1.030)

## 2015-05-16 NOTE — ED Notes (Signed)
Patient to ER for c/o urinary retention. Patient has pulled out 12 foley catheters in last few weeks (chronic urinary retention). Staff have been doing in & out caths d/t patient pulling out catheter. Patient's bladder scanned upon arrival with 672 ml urine present.

## 2015-05-16 NOTE — ED Provider Notes (Signed)
[facility] Emergency Department Provider Note  ____________________________________________  Time seen: Approximately 4:50 PM  I have reviewed the triage vital signs and the nursing notes.   HISTORY  Chief Complaint Urinary Retention  History is limited by the patient's chronic dementia.  HPI Tiffany Krause is a 81 y.o. female with a reported history of dementia, chronic or at least subacute T11 fracture, and urinary retention with a chronic Foley until just recently who presents from [facility]) with concerns for urinary retention and inability to in and out authorized for urine.  As per paramedic report, the patient had an indwelling Foley until the last couple of days but she kept pulling it out intact.  As a result they were doing in and out catheterizations as needed but the patient was able to reduce some urine on her own.  Today, however, they performed an ultrasound that showed at least 500 mL in her bladder but the nurse at the facility was unable to successfully catheterize her.  The nurse feels like she was doing the procedure correctly and has 15 years experience doing so, so she is concerned there may be another issue.  In spite of her dementia, the patient is alert and she declines any pain at this time other than pain in her back which is chronic.   Past Medical History  Diagnosis Date  . Thyroid disease   . Glaucoma   . Macular degeneration   . Dementia   . MI (mitral incompetence) 03/10/2011  . Colon polyp 05/14/2015  . Adult hypothyroidism 05/14/2015    Overview:  Multinodular goiter.   . Sacral fracture, closed ([place]) 04/30/2015  . Osteopenia 05/14/2015  . Osteoporosis, post-menopausal 05/14/2015  . CA of skin 05/14/2015    Overview:  PREVIOUS   . Stress fracture of sacrum 05/02/2015  . Chronic interstitial cystitis 05/14/2015  . 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 Extensive workup in 2012 - negative cystoscopy, treated UTI, treated with Vesicare (nausea), Uribel, Pyridium, Detrol - all with no relief of symptoms. Per [facility], she is diagnosed with interstitial cystitis though patient is not aware of that diagnosis.   . Intractable low back pain 04/30/2015  . HLD (hyperlipidemia) 05/14/2015  . Degeneration macular 05/14/2015    Patient Active Problem List   Diagnosis Date Noted  . Chronic interstitial cystitis 05/14/2015  . Colon polyp 05/14/2015  . HLD (hyperlipidemia) 05/14/2015  . Adult hypothyroidism 05/14/2015  . Degeneration macular 05/14/2015  . Osteopenia 05/14/2015  . Osteoporosis, post-menopausal 05/14/2015  . CA of skin 05/14/2015  . Urinary retention 05/14/2015  . Atrophic vaginitis 05/14/2015  . Stress fracture of sacrum 05/02/2015  . Sacral fracture, closed ([place]) 04/30/2015  . Intractable low back pain 04/30/2015  . Glaucoma 03/10/2011  . MI (mitral incompetence) 03/10/2011  . 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 Cataract extraction    . Cholecystectomy  2003    Current Outpatient Rx  Name  Route  Sig  Dispense  Refill  . acetaminophen (TYLENOL) 325 MG tablet   Oral   Take 650 mg by mouth 4 (four) times daily as needed for mild pain.         . bisacodyl (DULCOLAX) 10 MG suppository   Rectal   Place 10 mg rectally as needed for moderate constipation.         Marland Kitchen  calcium-vitamin D 250-100 MG-UNIT tablet   Oral   Take 1 tablet by mouth 2 (two) times daily.   60 tablet   0   . conjugated estrogens (PREMARIN) vaginal cream   Vaginal   Place 1 Applicatorful vaginally daily. Apply 0.5mg  (pea-sized amount)  just inside the vaginal introitus with a finger-tip every night for two weeks and then Monday, Wednesday and Friday nights.   30 g   12   . diclofenac (FLECTOR) 1.3 % PTCH   Transdermal   Place 1 patch onto the  skin 2 (two) times daily.         Marland Kitchen docusate sodium (COLACE) 100 MG capsule   Oral   Take 1 capsule (100 mg total) by mouth 2 (two) times daily.   10 capsule   0   . ibandronate (BONIVA) 150 MG tablet   Oral   Take 150 mg by mouth every 30 (thirty) days. Pt takes on the 10th of every month.   Take in the morning with a full glass of water, on an empty stomach, and do not take anything else by mouth or lie down for the next 30 min.         Marland Kitchen levothyroxine (SYNTHROID, LEVOTHROID) 88 MCG tablet   Oral   Take 88 mcg by mouth daily before breakfast.         . magnesium citrate SOLN   Oral   Take 1 Bottle by mouth as needed for severe constipation.         . Multiple Vitamin (MULTIVITAMIN WITH MINERALS) TABS tablet   Oral   Take 1 tablet by mouth daily.         . Multiple Vitamins-Minerals (PRESERVISION AREDS 2) CAPS   Oral   Take 1 capsule by mouth 2 (two) times daily.         Marland Kitchen oxyCODONE-acetaminophen (PERCOCET) 5-325 MG tablet   Oral   Take 2 tablets by mouth every 6 (six) hours as needed for moderate pain or severe pain.   30 tablet   0   . polyethylene glycol (MIRALAX / GLYCOLAX) packet   Oral   Take 17 g by mouth daily as needed for mild constipation.   14 each   0   . tamsulosin (FLOMAX) 0.4 MG CAPS capsule   Oral   Take 1 capsule (0.4 mg total) by mouth daily.   30 capsule   0   . timolol (TIMOPTIC-XR) 0.5 % ophthalmic gel-forming   Both Eyes   Place 1 drop into both eyes daily.          . traMADol (ULTRAM) 50 MG tablet   Oral   Take 1 tablet (50 mg total) by mouth every 6 (six) hours as needed for moderate pain.   30 tablet   0     Allergies Penicillins; Doxycycline hyclate; Erythromycin; and Fosamax  Family History  Problem Relation Age of Onset  . Diabetes Neg Hx   . Bladder Cancer Neg Hx   . Prostate cancer Neg Hx   . Kidney cancer Neg Hx     Social History Social History  Substance Use Topics  . Smoking status: Never Smoker    . Smokeless tobacco: None  . Alcohol Use: 0.6 oz/week    1 Glasses of wine per week     Comment: 1-2x a month    Review of Systems Limited due to dementia; patient only endorses some chronic back pain.  The report is only of  urinary retention and there is no report of any fever, abdominal pain, difficulty breathing, or vomiting.  ____________________________________________   PHYSICAL EXAM:  ED Triage Vitals  Enc Vitals Group     BP 05/16/15 1650 121/51 mmHg     Pulse Rate 05/16/15 1650 77     Resp 05/16/15 1650 18     Temp 05/16/15 1650 98.3 F (36.8 C)     Temp Source 05/16/15 1650 Oral     SpO2 05/16/15 1650 96 %     Weight 05/16/15 1650 135 lb 8 oz (61.462 kg)     Height 05/16/15 1650 5\' 3"  (1.6 m)     Head Cir --      Peak Flow --      Pain Score 05/16/15 1650 0     Pain Loc --      Pain Edu? --      Excl. in [place]? --      Constitutional: Elderly, alert, in no acute distress. Eyes: Conjunctivae are normal. PERRL. EOMI. Head: Atraumatic. Nose: No congestion/rhinnorhea. Mouth/Throat: Mucous membranes are moist.  Oropharynx non-erythematous. Neck: No stridor.   Cardiovascular: Normal rate, regular rhythm. Grossly normal heart sounds.  Good peripheral circulation. Respiratory: Normal respiratory effort.  No retractions. Lungs CTAB. Gastrointestinal: Soft and nontender. No distention. No abdominal bruits. No CVA tenderness. Genitourinary:  Initial bladder scan reveals >600 mL of urine. Musculoskeletal: No lower extremity tenderness nor edema.  No joint effusions. Neurologic:  No gross focal neurologic deficits are appreciated including no gross cranial nerve deficits. Skin:  Skin is warm, dry and intact. No rash noted. Psychiatric: Patient is calm, answers simple questions appropriately.  No acute abnormalities.  ____________________________________________   LABS (all labs ordered are listed, but only abnormal results are displayed)  Labs Reviewed  URINALYSIS  COMPLETEWITH MICROSCOPIC ([place]) - Abnormal; Notable for the following:    Color, Urine AMBER (*)    APPearance CLEAR (*)    Hgb urine dipstick 2+ (*)    Squamous Epithelial / LPF 0-5 (*)    All other components within normal limits   ____________________________________________  EKG  Not indicated ____________________________________________  RADIOLOGY  No results found.  ____________________________________________   PROCEDURES  Procedure(s) performed: None  Critical Care performed: No ____________________________________________   INITIAL IMPRESSION / ASSESSMENT AND PLAN / ED COURSE  Pertinent labs & imaging results that were available during my care of the patient were reviewed by me and considered in my medical decision making (see chart for details).  The patient was sent for evaluation of acute on chronic urinary retention.  A caregiver who does not appear to be a direct family member has arrived to the emergency department I spoke with her and she confirmed the history as I understood it based on the existing medical records and from the report from the paramedics.  We will proceed with the placement of an indwelling Foley catheter given her well-documented history of urinary retention given that in and out catheterization has not been successful today.  After this procedure we will send the urinalysis to the lab as I expect it will likely have a degree of infection given her frequent instrumentation.  I will then touch base with the patient's daughter; her name is Dr. Chapman Fitch and her phone number is [phone].  ----------------------------------------- 5:36 PM on 05/16/2015 -----------------------------------------  I had an extensive conversation with the patient's daughter, Dr. Amie Krause.  We are in agreement about the management plan: Place an indwelling Foley for the  patient's ongoing retention and follow up with urology as an outpatient, as well as  testing the urine with a urinalysis and treating as needed.  ----------------------------------------- 6:21 PM on 05/16/2015 -----------------------------------------  UA unremarkable.  Discussed again with daughter.  Everyone is comfortable with plan for discharge and outpatient follow-up with [facility] urology.  Transport by private vehicle.   ____________________________________________  FINAL CLINICAL IMPRESSION(S) / ED DIAGNOSES  Final diagnoses:  Urinary retention      NEW MEDICATIONS STARTED DURING THIS VISIT:  New Prescriptions   No medications on file     Hinda Kehr, MD 05/16/15 1827

## 2015-05-16 NOTE — Discharge Instructions (Signed)
You were seen in the Emergency Department (ED) for urinary retention.  We placed a Foley catheter to help your bladder drain.  Please read through the included information and follow up with your doctor as recommended in these papers; your doctor will see you in clinic and help you determine when it is time to have the catheter removed.  If you stop producing urine in the bag or if you develop other symptoms that concern you, such as fever, chills, persistent vomiting, or severe abdominal pain, please return immediately to the Emergency Department.    Acute Urinary Retention, Female Acute urinary retention is the temporary inability to urinate. This is an uncommon problem in women. It can be caused by:  Infection.  A side effect of a medicine.  A problem in a nearby organ that presses or squeezes on the bladder or the urethra (the tube that drains the bladder).  Psychological problems.   Surgery on your bladder, urethra, or pelvic organs that causes obstruction to the outflow of urine from your bladder. HOME CARE INSTRUCTIONS  If you are sent home with a Foley catheter and a drainage system, you will need to discuss the best course of action with your health care provider. While the catheter is in, maintain a good intake of fluids. Keep the drainage bag emptied and lower than your catheter. This is so that contaminated urine will not flow back into your bladder, which could lead to a urinary tract infection. There are two main types of drainage bags. One is a large bag that usually is used at night. It has a good capacity that will allow you to sleep through the night without having to empty it. The second type is called a leg bag. It has a smaller capacity so it needs to be emptied more frequently. However, the main advantage is that it can be attached by a leg strap and goes underneath your clothing, allowing you the freedom to move about or leave your home. Only take over-the-counter or  prescription medicines for pain, discomfort, or fever as directed by your health care provider.  SEEK MEDICAL CARE IF:  You develop a low-grade fever.  You experience spasms or leakage of urine with the spasms. SEEK IMMEDIATE MEDICAL CARE IF:   You develop chills or fever.  Your catheter stops draining urine.  Your catheter falls out.  You start to develop increased bleeding that does not respond to rest and increased fluid intake. MAKE SURE YOU:  Understand these instructions.  Will watch your condition.  Will get help right away if you are not doing well or get worse.   This information is not intended to replace advice given to you by your health care provider. Make sure you discuss any questions you have with your health care provider.   Document Released: 05/30/2006 Document Revised: 10/15/2014 Document Reviewed: 11/09/2012 Elsevier Interactive Patient Education 2016 Elsevier Inc.   Urodynamic Testing WHAT IS URODYNAMIC TESTING? Urodynamic tests are done to determine how well your lower urinary tract is working. The lower urinary tract includes your bladder and the tube that empties your bladder (urethra).  When your kidneys filter your blood, urine is stored in your bladder until you feel the urge to pass urine (urinate). Urination requires coordination between the nerves and muscles of your bladder and urethra. When your lower urinary tract is working well, you should be able to:   Start urinating when your bladder is full.  Empty your bladder completely.  Control  the flow of your urine. WHY DO I NEED URODYNAMIC TESTING? You may need urodynamic testing if you:  Are leaking urine (incontinence).  Have problems starting or stopping your urine flow.  Have frequent or painful urination.  Have frequent urinary tract infections.  Cannot empty your bladder completely.  Have strong urges to pass urine (urgency).  Have a weak flow of urine. HOW IS URODYNAMIC  TESTING DONE? Urodynamic tests may be done separately or all during one testing visit. These tests may be done at your health care provider's office, a clinic, or a hospital. You may be given an antibiotic medicine before or after testing to prevent infection. Ask your health care provider if you should:  Stop taking any of your regular medicines.  Arrive for the test with a full bladder. The urodynamic tests you may have done include: Uroflowmetry This test measures how much urine you pass and how long it takes to pass.  You sit on a special toilet to urinate.  The toilet measures the volume and the time of your urine flow.  These measurements are sent to a computer that creates a graph of your urine flow. Postvoid residual measurement This test measures how much urine is left in your bladder after you urinate.  It uses sound waves (ultrasound) to create an image of your bladder.  The test can also be done by inserting a thin, flexible tube (catheter) into your bladder after you urinate.  Remaining urine is measured in milliliters (mL). If you have more than 100 mL left in your bladder after you urinate, your bladder is not emptying as it should. Cystometric testing This test uses a special bladder catheter that can measure pressure.   A numbing medicine (local anesthetic) may be used.  First, a normal catheter is used to empty your bladder completely.  Then the measuring catheter is placed, and your bladder is filled with warm, germ-free (sterile) water.  Pressure measurements will be taken:  As your bladder fills.  When you feel the need to urinate.  As your bladder is emptied.  You may be asked to cough or bear down to check for leakage.  In some cases, your bladder may be filled with a material that shows up on X-rays (contrast material) so that X-ray pictures can be taken during the test. Electromyography This test measures the electrical activity of the nerves and  muscles of your bladder and the opening of your urethra.   It tells how well your nerves are communicating with your muscles.  Sticky patches are placed near your rectum and urethra to measure electrical activity. WHAT ARE THE RISKS OF THIS TESTING? Generally, these tests are safe. However, problems can occur and include:  Discomfort.  Frequent urge to urinate.  Bleeding.  Infection.  An allergic reaction to contrast material, if contrast material is used. WHAT HAPPENS AFTER THE TESTING?   You should be able to go home right away and do your usual activities.  You may be instructed to drink a tall glass of water every 30 minutes for the first 2 hours you are home.  Taking a warm bath or using a warm compress may relieve any discomfort near your urethra. Let your health care provider know if you have:  Pain.  Blood in your urine.  Chills.  Fever. WHAT DO MY TEST RESULTS MEAN? Discuss the results of your urodynamic tests with your health care provider. Your health care provider will use the results of these and  other tests, along with your signs and symptoms, to make a diagnosis. Some common causes for abnormal results from urodynamic tests include:  Enlarged prostate in men.  Overactive bladder.  Urinary tract infection.  Nervous system diseases.  Spinal cord damage.   This information is not intended to replace advice given to you by your health care provider. Make sure you discuss any questions you have with your health care provider.   Document Released: 03/28/2007 Document Revised: 06/21/2014 Document Reviewed: 09/10/2013 Elsevier Interactive Patient Education [employer].

## 2015-05-16 NOTE — ED Notes (Signed)
Patient returned to [facility] via POV of home caregiver. Attempted to call report x3 to [facility] with no answer on phone line (received recording with no answer). Patient was sent home with leg bag and foley in place.

## 2015-05-19 ENCOUNTER — Telehealth: Payer: Self-pay | Admitting: Urology

## 2015-05-19 NOTE — Telephone Encounter (Signed)
I spoke with Tiffany Krause and [name] will keep her foley catheter in place until she sees [place] on 05/26/2015.  We will be teaching the caregivers how to perform CIC.

## 2015-05-19 NOTE — Telephone Encounter (Signed)
I was under the impression that the folks at [facility] were going to instruct Tiffany Krause's caregivers on CIC.  If this is not possible, we can certainly instruct them on CIC.  They will have to make an appointment with [name] present, so we can demonstrate the technique.

## 2015-05-19 NOTE — Telephone Encounter (Signed)
Daughter called back and Larene [place] wanted message retrieved from Chelsea's box and sent back to her.

## 2015-05-19 NOTE — Telephone Encounter (Signed)
Pt's daughter, Marcie Bal, called.  Pt is being DC'd from [facility] on Wed, 12/7.  Has had an indwelling Foley cath put in b/c [facility] could not cath her.  Pt's daughter needs to know plan of action/care for pt being sent home.  Please call [phone].

## 2015-05-26 ENCOUNTER — Encounter: Payer: Self-pay | Admitting: Urology

## 2015-05-26 ENCOUNTER — Ambulatory Visit (INDEPENDENT_AMBULATORY_CARE_PROVIDER_SITE_OTHER): Payer: Medicare Other | Admitting: Urology

## 2015-05-26 VITALS — BP 110/68 | HR 70 | Ht 63.0 in | Wt 126.5 lb

## 2015-05-26 DIAGNOSIS — R339 Retention of urine, unspecified: Secondary | ICD-10-CM

## 2015-05-26 NOTE — Telephone Encounter (Signed)
No

## 2015-05-26 NOTE — Progress Notes (Signed)
05/26/2015 2:07 PM   Melvyn Novas November 25, 1920 [id number]  Referring provider: Idelle Crouch, MD [facility] [place], [place] [address]  Chief Complaint  Patient presents with  . Discussion with caretakers    discuss and teach CIC to caretakers    HPI: Patient is a 81 year old white female who presents today with her daughter and three caregivers to learn CIC.  Patient developed urinary retention after a sacral compression fracture suffered on 04/26/2015.  She had failed two voiding trials and had a traumatic foley removal during her stay at [facility].  The daughter wanted alternatives to having an indwelling foley and it was decided to address the patient's urinary retention issues with CIC.     Continuous Intermittent Catheterization  Patient was placed in lithotomy on the table.  The foley balloon was drained of 10 cc of fluid and the foley catheter was removed without difficulty.  I then identified the urethral meatus to each of the caregivers.  Patient was cleaned and prepped in a sterile fashion.  With instruction and assistance, I demonstrated for the caregivers how to insert a straight cath.  I used a 62f female catheter for the demonstration.  I then explained how they could insert a finger into the vaginal canal to make sure they were in the bladder and not the vaginal vault if no urine was returned.  We did not receive any urine due the fact the foley was just removed.  Patient tolerated well, no complications were noted.   Patient was given a sample bag with supplies to take home.  Instructions were given for patient to be cathed at least one time daily.  They are to record the residuals and to try and keep the residuals 200 cc or less.  If the patient's residual was above the 200 cc mark, they would need to cath again that day.  If the residual was over 200 cc mark, they were to cath a subsequent time that day.  If the residual was less than 200 cc,  they could forgo cathing that day unless the patient could not void on her own or was complaining of suprapubic discomfort.  The patient is not taking in a lot of fluids at this time.  An order was placed with [facility] for catheters to be sent to the patient's home. Patient is to follow up in one month.  They are to call or come in sooner if they have any questions or concerns.    PMH: Past Medical History  Diagnosis Date  . Thyroid disease   . Glaucoma   . Macular degeneration   . Dementia   . MI (mitral incompetence) 03/10/2011  . Colon polyp 05/14/2015  . Adult hypothyroidism 05/14/2015    Overview:  Multinodular goiter.   . Sacral fracture, closed ([place]) 04/30/2015  . Osteopenia 05/14/2015  . Osteoporosis, post-menopausal 05/14/2015  . CA of skin 05/14/2015    Overview:  PREVIOUS   . Stress fracture of sacrum 05/02/2015  . Chronic interstitial cystitis 05/14/2015  . 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 Extensive workup in 2012 - negative cystoscopy, treated UTI, treated with Vesicare (nausea), Uribel, Pyridium, Detrol - all with no relief of symptoms. Per [facility], she is diagnosed with interstitial cystitis though patient is not aware of that diagnosis.   . Intractable low back pain 04/30/2015  . HLD (  hyperlipidemia) 05/14/2015  . Degeneration macular 05/14/2015    Surgical History: Past Surgical History  Procedure Laterality Date  . Cataract extraction    . Cholecystectomy  2003  . Tonsillectomy      Home Medications:    Medication List       This list is accurate as of: 05/26/15  2:07 PM.  Always use your most recent med list.               acetaminophen 325 MG tablet  Commonly known as:  TYLENOL  Take 650 mg by mouth 4 (four) times daily as needed for mild pain.     bisacodyl 10 MG suppository  Commonly known as:  DULCOLAX  Place 10 mg rectally as needed for  moderate constipation.     calcium-vitamin D 250-100 MG-UNIT tablet  Take 1 tablet by mouth 2 (two) times daily.     conjugated estrogens vaginal cream  Commonly known as:  PREMARIN  Place 1 Applicatorful vaginally daily. Apply 0.5mg  (pea-sized amount)  just inside the vaginal introitus with a finger-tip every night for two weeks and then Monday, Wednesday and Friday nights.     diclofenac 1.3 % Ptch  Commonly known as:  FLECTOR  Place 1 patch onto the skin 2 (two) times daily.     docusate sodium 100 MG capsule  Commonly known as:  COLACE  Take 1 capsule (100 mg total) by mouth 2 (two) times daily.     ESTRACE VAGINAL 0.1 MG/GM vaginal cream  Generic drug:  estradiol     gentamicin 0.3 % ophthalmic solution  Commonly known as:  GARAMYCIN     ibandronate 150 MG tablet  Commonly known as:  BONIVA  Take 150 mg by mouth every 30 (thirty) days. Pt takes on the 10th of every month.   Take in the morning with a full glass of water, on an empty stomach, and do not take anything else by mouth or lie down for the next 30 min.     levothyroxine 88 MCG tablet  Commonly known as:  SYNTHROID, LEVOTHROID  Take 88 mcg by mouth daily before breakfast.     magnesium citrate Soln  Take 1 Bottle by mouth as needed for severe constipation.     multivitamin with minerals Tabs tablet  Take 1 tablet by mouth daily.     oxyCODONE-acetaminophen 5-325 MG tablet  Commonly known as:  PERCOCET  Take 2 tablets by mouth every 6 (six) hours as needed for moderate pain or severe pain.     polyethylene glycol packet  Commonly known as:  MIRALAX / GLYCOLAX  Take 17 g by mouth daily as needed for mild constipation.     PRESERVISION AREDS 2 Caps  Take 1 capsule by mouth 2 (two) times daily.     tamsulosin 0.4 MG Caps capsule  Commonly known as:  FLOMAX  Take 1 capsule (0.4 mg total) by mouth daily.     timolol 0.5 % ophthalmic gel-forming  Commonly known as:  TIMOPTIC-XR  Place 1 drop into both eyes  daily.     traMADol 50 MG tablet  Commonly known as:  ULTRAM  Take 1 tablet (50 mg total) by mouth every 6 (six) hours as needed for moderate pain.        Allergies:  Allergies  Allergen Reactions  . Penicillins Other (See Comments)    Reaction:  Unknown   . Doxycycline Hyclate Nausea And Vomiting  . Erythromycin Other (See Comments)  Reaction:  Unknown   . Fosamax [Alendronate] Other (See Comments)    Reaction:  Unknown     Family History: Family History  Problem Relation Age of Onset  . Diabetes Neg Hx   . Bladder Cancer Neg Hx   . Prostate cancer Neg Hx   . Kidney cancer Neg Hx     Social History:  reports that she has never smoked. She does not have any smokeless tobacco history on file. She reports that she drinks about 0.6 oz of alcohol per week. She reports that she does not use illicit drugs.   Physical Exam: BP 110/68 mmHg  Pulse 70  Ht 5\' 3"  (1.6 m)  Wt 126 lb 8 oz (57.38 kg)  BMI 22.41 kg/m2  Constitutional: Well nourished. Alert and oriented, No acute distress. HEENT: [place] AT, moist mucus membranes. Trachea midline, no masses. Cardiovascular: No clubbing, cyanosis, or edema. Respiratory: Normal respiratory effort, no increased work of breathing. GI: Abdomen is soft, non tender, non distended, no abdominal masses. Liver and spleen not palpable.  No hernias appreciated.  Stool sample for occult testing is not indicated.   GU: No CVA tenderness.  No bladder fullness or masses.   Rectal:  Anus and perineum without scarring or rashes.  Skin: No rashes, bruises or suspicious lesions. Lymph: No cervical or inguinal adenopathy. Neurologic: Grossly intact, no focal deficits, moving all 4 extremities. Psychiatric: Normal mood and affect.  Laboratory Data: Lab Results  Component Value Date   WBC 5.5 04/30/2015   HGB 10.8* 04/30/2015   HCT 33.0* 04/30/2015   MCV 96.6 04/30/2015   PLT 122* 04/30/2015    Lab Results  Component Value Date   CREATININE  0.73 04/30/2015     Assessment & Plan:    1. Urinary retention:   Patient's caregivers are instructed on CIC technique.  They feel comfortable and our willing to proceed with CIC.  They are to contact the office with any questions, they will keep a log bog of PVR's and they will RTC in one month for an office visit.   Due to permanent urinary retention, the patient needs to cath 4 times daily for a lifetime.    Greater than 50% was spent in counseling & coordination of care with the patient.   Return in about 1 month (around 06/26/2015).  Zara Council, [facility] [address] [place], [place] [address] [phone]

## 2015-05-26 NOTE — Telephone Encounter (Signed)
Would you send a request for catheters for this patient to [facility]?

## 2015-05-26 NOTE — Telephone Encounter (Signed)
I sent a script when she was orginially taught CIC. Does this need to be a different script?

## 2015-05-28 ENCOUNTER — Telehealth: Payer: Self-pay

## 2015-05-28 NOTE — Telephone Encounter (Signed)
They can can reduce the cathing to once daily, but please still record the residuals.  I would like to see how she does other the weekend.  Please call back Monday with the report.

## 2015-05-28 NOTE — Telephone Encounter (Signed)
[name], pt caregiver, called asking if they need to continue to cath pt. [name] stated that pt was cath'd three times yesterday morning-450, lunch-200, evening-50. In between pt urinated several times in the toilet 100cc each time and had saturated pull ups three times. Please advise.

## 2015-05-30 NOTE — Telephone Encounter (Signed)
Spoke with pt caregiver in reference to CIC. Caregiver stated she and pt daughter had already decided to cath once a day. Nurse reinforced the need for a call report every other Monday. Caregiver stated she would make the daughter aware.

## 2015-06-06 ENCOUNTER — Telehealth: Payer: Self-pay | Admitting: Urology

## 2015-06-06 NOTE — Telephone Encounter (Signed)
I spoke with the patient's daughter, Tiffany Krause, about her CIC, postvoid residuals and incontinence. [name]'s caregiver states that when they cath her after she voids, they are getting residuals under 200 cc. They are only cathing her once daily. The patient is experiencing incontinence upon standing after she had been sitting for several hours. The caregiver's are weighing the incontinence pads and patient is having an incontinent volumes of 200-400 cc. I have recommended that the patient continue the Flomax.   And, she has timed voids, every 2 hours while awake and record the volumes. We will reassess when I return to the office in 2 weeks.

## 2015-06-17 ENCOUNTER — Ambulatory Visit (INDEPENDENT_AMBULATORY_CARE_PROVIDER_SITE_OTHER): Payer: Medicare Other | Admitting: Urology

## 2015-06-17 ENCOUNTER — Encounter: Payer: Self-pay | Admitting: Urology

## 2015-06-17 ENCOUNTER — Telehealth: Payer: Self-pay | Admitting: Urology

## 2015-06-17 VITALS — BP 107/69 | HR 69 | Ht 63.0 in | Wt 123.7 lb

## 2015-06-17 DIAGNOSIS — N952 Postmenopausal atrophic vaginitis: Secondary | ICD-10-CM

## 2015-06-17 DIAGNOSIS — R339 Retention of urine, unspecified: Secondary | ICD-10-CM

## 2015-06-17 DIAGNOSIS — N393 Stress incontinence (female) (male): Secondary | ICD-10-CM

## 2015-06-17 DIAGNOSIS — N390 Urinary tract infection, site not specified: Secondary | ICD-10-CM | POA: Diagnosis not present

## 2015-06-17 LAB — URINALYSIS, COMPLETE
Bilirubin, UA: NEGATIVE
Glucose, UA: NEGATIVE
Ketones, UA: NEGATIVE
NITRITE UA: NEGATIVE
Specific Gravity, UA: 1.015 (ref 1.005–1.030)
UUROB: 0.2 mg/dL (ref 0.2–1.0)

## 2015-06-17 LAB — MICROSCOPIC EXAMINATION: EPITHELIAL CELLS (NON RENAL): NONE SEEN /HPF (ref 0–10)

## 2015-06-17 LAB — BLADDER SCAN AMB NON-IMAGING: SCAN RESULT: 187

## 2015-06-17 NOTE — Telephone Encounter (Signed)
Would you please add the caregiver's, Juliann Pulse, mobile number to her chart?  It's [phone].

## 2015-06-17 NOTE — Progress Notes (Signed)
12:56 PM   Tiffany Krause Mar 16, 1921 [id number]  Referring provider: Idelle Crouch, MD [facility] [place], [place] [address]  Chief Complaint  Patient presents with  . Urinary Retention    follow up 2 weeks  . Vaginitis    HPI: Patient is a 82 year old Caucasian female who presents today with her daughter, Marcie Bal, and her two caregivers, Hoyle Sauer and Juliann Pulse to discuss have the last two weeks have been without CIC and timed voiding.    Previous history Patient developed urinary retention after a sacral compression fracture suffered on 04/26/2015.  She had failed two voiding trials and had a traumatic foley removal during her stay at [facility].  The caregivers were instructed on CIC and they were to cath her to keep her residuals below 200 cc.  The patient's residuals were noted to be minimal and she had been having episode of SUI.  She is leaking a fare amount of urine when she stands.  Her caregivers are very diligent on keeping the patient clean and dry.  The patient has been having foul smelling urine for the last several weeks.  There has not been any gross hematuria, fevers or mental confusion.  Her leakage is still continuing with the timed voids and her PVR's are minimal.  Her UA is suspicious for infection today.      PMH: Past Medical History  Diagnosis Date  . Thyroid disease   . Glaucoma   . Macular degeneration   . Dementia   . MI (mitral incompetence) 03/10/2011  . Colon polyp 05/14/2015  . Adult hypothyroidism 05/14/2015    Overview:  Multinodular goiter.   . Sacral fracture, closed ([place]) 04/30/2015  . Osteopenia 05/14/2015  . Osteoporosis, post-menopausal 05/14/2015  . CA of skin 05/14/2015    Overview:  PREVIOUS   . Stress fracture of sacrum 05/02/2015  . Chronic interstitial cystitis 05/14/2015  . 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  Extensive workup in 2012 - negative cystoscopy, treated UTI, treated with Vesicare (nausea), Uribel, Pyridium, Detrol - all with no relief of symptoms. Per [facility], she is diagnosed with interstitial cystitis though patient is not aware of that diagnosis.   . Intractable low back pain 04/30/2015  . HLD (hyperlipidemia) 05/14/2015  . Degeneration macular 05/14/2015    Surgical History: Past Surgical History  Procedure Laterality Date  . Cataract extraction    . Cholecystectomy  2003  . Tonsillectomy      Home Medications:    Medication List       This list is accurate as of: 06/17/15 12:56 PM.  Always use your most recent med list.               acetaminophen 325 MG tablet  Commonly known as:  TYLENOL  Take 650 mg by mouth 4 (four) times daily as needed for mild pain. Reported on 06/17/2015     bisacodyl 10 MG suppository  Commonly known as:  DULCOLAX  Place 10 mg rectally as needed for moderate constipation. Reported on 06/17/2015     calcium-vitamin D 250-100 MG-UNIT tablet  Take 1 tablet by mouth 2 (two) times daily.     conjugated estrogens vaginal cream  Commonly known as:  PREMARIN  Place 1 Applicatorful vaginally daily. Apply 0.5mg  (pea-sized amount)  just inside the vaginal introitus with a finger-tip every night for two weeks and  then Monday, Wednesday and Friday nights.     diclofenac 1.3 % Ptch  Commonly known as:  FLECTOR  Place 1 patch onto the skin 2 (two) times daily. Reported on 06/17/2015     docusate sodium 100 MG capsule  Commonly known as:  COLACE  Take 1 capsule (100 mg total) by mouth 2 (two) times daily.     ESTRACE VAGINAL 0.1 MG/GM vaginal cream  Generic drug:  estradiol  Reported on 06/17/2015     gentamicin 0.3 % ophthalmic solution  Commonly known as:  GARAMYCIN  Reported on 06/17/2015     ibandronate 150 MG tablet  Commonly known as:  BONIVA  Take 150 mg by mouth every 30 (thirty) days. Pt takes on the 10th of every month.   Take in  the morning with a full glass of water, on an empty stomach, and do not take anything else by mouth or lie down for the next 30 min.     levothyroxine 88 MCG tablet  Commonly known as:  SYNTHROID, LEVOTHROID  Take 88 mcg by mouth daily before breakfast.     magnesium citrate Soln  Take 1 Bottle by mouth as needed for severe constipation. Reported on 06/17/2015     multivitamin with minerals Tabs tablet  Take 1 tablet by mouth daily.     oxyCODONE-acetaminophen 5-325 MG tablet  Commonly known as:  PERCOCET  Take 2 tablets by mouth every 6 (six) hours as needed for moderate pain or severe pain.     polyethylene glycol packet  Commonly known as:  MIRALAX / GLYCOLAX  Take 17 g by mouth daily as needed for mild constipation.     PRESERVISION AREDS 2 Caps  Take 1 capsule by mouth 2 (two) times daily.     tamsulosin 0.4 MG Caps capsule  Commonly known as:  FLOMAX  Take 1 capsule (0.4 mg total) by mouth daily.     timolol 0.5 % ophthalmic gel-forming  Commonly known as:  TIMOPTIC-XR  Place 1 drop into both eyes daily.     traMADol 50 MG tablet  Commonly known as:  ULTRAM  Take 1 tablet (50 mg total) by mouth every 6 (six) hours as needed for moderate pain.        Allergies:  Allergies  Allergen Reactions  . Penicillins Other (See Comments)    Reaction:  Unknown   . Doxycycline Hyclate Nausea And Vomiting  . Erythromycin Other (See Comments)    Reaction:  Unknown   . Fosamax [Alendronate] Other (See Comments)    Reaction:  Unknown     Family History: Family History  Problem Relation Age of Onset  . Diabetes Neg Hx   . Bladder Cancer Neg Hx   . Prostate cancer Neg Hx   . Kidney cancer Neg Hx     Social History:  reports that she has never smoked. She does not have any smokeless tobacco history on file. She reports that she drinks about 0.6 oz of alcohol per week. She reports that she does not use illicit drugs.   Physical Exam: BP 107/69 mmHg  Pulse 69  Ht 5\' 3"   (1.6 m)  Wt 123 lb 11.2 oz (56.11 kg)  BMI 21.92 kg/m2  Constitutional: Well nourished. Alert and oriented, No acute distress. HEENT: [place] AT, moist mucus membranes. Trachea midline, no masses. Cardiovascular: No clubbing, cyanosis, or edema. Respiratory: Normal respiratory effort, no increased work of breathing. GI: Abdomen is soft, non tender, non distended, no abdominal  masses. Liver and spleen not palpable.  No hernias appreciated.  Stool sample for occult testing is not indicated.   GU: No CVA tenderness.  No bladder fullness or masses.  Atrophic vagina mucosa, poor estrogen effect, no discharge, no lesions, vaginal canal is extremely narrowed. I could only get one finger into the vault. I could not perform a bimanual exam. A 5 mm x 5 mm caruncle is noted.  She did demonstrate leakage with Valsalva.    Skin: No rashes, bruises or suspicious lesions. Rectal:  Anus and perineum without scarring or rashes.  Skin: No rashes, bruises or suspicious lesions. Lymph: No cervical or inguinal adenopathy. Neurologic: Grossly intact, no focal deficits, moving all 4 extremities. Psychiatric: Normal mood and affect.  Laboratory Data: Lab Results  Component Value Date   WBC 5.5 04/30/2015   HGB 10.8* 04/30/2015   HCT 33.0* 04/30/2015   MCV 96.6 04/30/2015   PLT 122* 04/30/2015    Lab Results  Component Value Date   CREATININE 0.73 04/30/2015   Urinalysis Results for orders placed or performed in visit on 06/17/15  Microscopic Examination  Result Value Ref Range   WBC, UA >30 (H) 0 -  5 /hpf   RBC, UA 0-2 0 -  2 /hpf   Epithelial Cells (non renal) None seen 0 - 10 /hpf   Bacteria, UA Many (A) None seen/Few  Urinalysis, Complete  Result Value Ref Range   Specific Gravity, UA 1.015 1.005 - 1.030   pH, UA >=9.0 5.0 - 7.5   Color, UA Yellow Yellow   Appearance Ur Turbid (A) Clear   Leukocytes, UA Trace (A) Negative   Protein, UA 2+ (A) Negative/Trace   Glucose, UA Negative  Negative   Ketones, UA Negative Negative   RBC, UA 1+ (A) Negative   Bilirubin, UA Negative Negative   Urobilinogen, Ur 0.2 0.2 - 1.0 mg/dL   Nitrite, UA Negative Negative   Microscopic Examination See below:   BLADDER SCAN AMB NON-IMAGING  Result Value Ref Range   Scan Result 187     Assessment & Plan:    1. Urinary retention:   Seems to have resolved at this time.  We will stop the tamsulosin.  I will touch base with Juliann Pulse [phone]) at the end of the week to discuss Mrs. Freedman's progress.    2. SUI:   Patient's daughter and caregivers are comfortable with treating the incontinence conservatively by keeping her perineum clean and dry and the use of depends.  As long as there is no significant skin breakdown, we can continue this as it is safer for the patient.  An indwelling foley would only increase her chance of infection even more.    3.  Atrophic vaginitis:   Patient's caregivers are continuing to use the vaginal estrogen cream.    4. UTI:   UA is suspicious for infection and I will send it for culture.  I will wait for the culture results until prescribing an antibiotics.  Follow up will be pending on the results.    Greater than 50% was spent in counseling & coordination of care with the patient.   Return for pending labs.  Zara Council, [facility] [address], [place], [place] [address] [phone]

## 2015-06-17 NOTE — Progress Notes (Signed)
In and Out Catheterization  Patient is present today for a I & O catheterization due to foul smelling urine and recheck uti . Patient was cleaned and prepped in a sterile fashion with betadine and Lidocaine 2% jelly was instilled into the urethra.  A 14FR cath was inserted no complications were noted , 232ml of urine return was noted, urine was dark yellow and cloudy in color. A clean urine sample was collected for Urinalyasis. Bladder was drained  and catheter was removed with out difficulty.    Preformed by: Lyndee Hensen CMA

## 2015-06-19 ENCOUNTER — Telehealth: Payer: Self-pay

## 2015-06-19 DIAGNOSIS — N390 Urinary tract infection, site not specified: Secondary | ICD-10-CM

## 2015-06-19 LAB — CULTURE, URINE COMPREHENSIVE

## 2015-06-19 MED ORDER — NITROFURANTOIN MONOHYD MACRO 100 MG PO CAPS: 100.0000 mg | ORAL_CAPSULE | Freq: Two times a day (BID) | ORAL | Status: AC

## 2015-06-19 NOTE — Telephone Encounter (Signed)
-----   Message from Nori Riis, PA-C sent at 06/19/2015  1:25 PM EST ----- Patient's urine culture is positive for infection.  She needs to start Macrobid 100 mg one capsule twice daily for seven days.  Then we need to check a CATH specimen 3 to 5 days after she finishes the antibiotic.  Please call the care giver Juliann Pulse with this information.

## 2015-06-19 NOTE — Telephone Encounter (Signed)
Spoke with pt caregiver, Hoyle Sauer, in reference to +ucx. Hoyle Sauer voiced understanding. Hoyle Sauer stated pt is having severe diarrhea and is going to see PCP today. Wynona Dove to make PCP aware of medication from [facility]. Hoyle Sauer voiced understanding. Medication sent to pharmacy.

## 2015-07-01 ENCOUNTER — Other Ambulatory Visit: Payer: Medicare Other

## 2015-07-01 ENCOUNTER — Telehealth: Payer: Self-pay | Admitting: *Deleted

## 2015-07-01 NOTE — Telephone Encounter (Signed)
Spoke with Juliann Pulse the caregiver, she dropped off a urine for the patient. I spoke to her because she was supposed to bring the patient in for a cath urine 3 to 5 days after finishing the antibiotic. I told her the urine that she brought in most likely would be contaminated and we need to stick with the original plan. Transferred to the front for [place] to make appointment for a nurse visit for a cath specimen.

## 2015-07-01 NOTE — Telephone Encounter (Signed)
Would you please add the caregiver's, Juliann Pulse, mobile number to her chart? It's [phone].

## 2015-07-01 NOTE — Telephone Encounter (Signed)
Number has been added to chart

## 2015-07-02 ENCOUNTER — Ambulatory Visit (INDEPENDENT_AMBULATORY_CARE_PROVIDER_SITE_OTHER): Payer: Medicare Other

## 2015-07-02 DIAGNOSIS — N390 Urinary tract infection, site not specified: Secondary | ICD-10-CM | POA: Diagnosis not present

## 2015-07-02 LAB — URINALYSIS, COMPLETE
Bilirubin, UA: NEGATIVE
Glucose, UA: NEGATIVE
Ketones, UA: NEGATIVE
LEUKOCYTES UA: NEGATIVE
Nitrite, UA: NEGATIVE
PH UA: 6.5 (ref 5.0–7.5)
Specific Gravity, UA: 1.02 (ref 1.005–1.030)
Urobilinogen, Ur: 0.2 mg/dL (ref 0.2–1.0)

## 2015-07-02 LAB — MICROSCOPIC EXAMINATION: EPITHELIAL CELLS (NON RENAL): NONE SEEN /HPF (ref 0–10)

## 2015-07-02 NOTE — Progress Notes (Signed)
In and Out Catheterization  Patient is present today for a I & O catheterization due to post abx. Patient was cleaned and prepped in a sterile fashion with betadine and Lidocaine 2% jelly was instilled into the urethra.  A 14FR cath was inserted no complications were noted , 367ml of urine return was noted, urine was amber in color. A clean urine sample was collected for u/a and cx. Bladder was drained  And catheter was removed with out difficulty.    Preformed by: Toniann Fail, LPN

## 2015-07-04 LAB — CULTURE, URINE COMPREHENSIVE

## 2015-07-07 ENCOUNTER — Telehealth: Payer: Self-pay

## 2015-07-07 NOTE — Telephone Encounter (Signed)
-----   Message from Nori Riis, PA-C sent at 07/06/2015  9:52 PM EST ----- Please notify the patient's caregiver that her urine did not have an infection.

## 2015-07-07 NOTE — Telephone Encounter (Signed)
Spoke with pt caregiver, Juliann Pulse, and made aware of -ucx. Juliann Pulse voiced understanding.

## 2016-09-03 ENCOUNTER — Emergency Department: Payer: Medicare Other

## 2016-09-03 ENCOUNTER — Emergency Department
Admission: EM | Admit: 2016-09-03 | Discharge: 2016-09-03 | Disposition: A | Discharge status: Home / Self Care | Payer: Medicare Other | Attending: Emergency Medicine | Admitting: Emergency Medicine

## 2016-09-03 ENCOUNTER — Encounter: Payer: Self-pay | Admitting: Medical Oncology

## 2016-09-03 DIAGNOSIS — E039 Hypothyroidism, unspecified: Secondary | ICD-10-CM | POA: Diagnosis not present

## 2016-09-03 DIAGNOSIS — Z79899 Other long term (current) drug therapy: Secondary | ICD-10-CM | POA: Insufficient documentation

## 2016-09-03 DIAGNOSIS — R319 Hematuria, unspecified: Secondary | ICD-10-CM | POA: Diagnosis not present

## 2016-09-03 DIAGNOSIS — R531 Weakness: Secondary | ICD-10-CM | POA: Insufficient documentation

## 2016-09-03 DIAGNOSIS — Z85828 Personal history of other malignant neoplasm of skin: Secondary | ICD-10-CM | POA: Insufficient documentation

## 2016-09-03 DIAGNOSIS — J029 Acute pharyngitis, unspecified: Secondary | ICD-10-CM | POA: Insufficient documentation

## 2016-09-03 LAB — CBC WITH DIFFERENTIAL/PLATELET
BASOS ABS: 0 10*3/uL (ref 0–0.1)
Basophils Relative: 1 %
Eosinophils Absolute: 0.2 10*3/uL (ref 0–0.7)
Eosinophils Relative: 3 %
HEMATOCRIT: 37.6 % (ref 35.0–47.0)
HEMOGLOBIN: 12.6 g/dL (ref 12.0–16.0)
LYMPHS PCT: 18 %
Lymphs Abs: 1.2 10*3/uL (ref 1.0–3.6)
MCH: 31.5 pg (ref 26.0–34.0)
MCHC: 33.4 g/dL (ref 32.0–36.0)
MCV: 94.4 fL (ref 80.0–100.0)
Monocytes Absolute: 0.6 10*3/uL (ref 0.2–0.9)
Monocytes Relative: 9 %
NEUTROS ABS: 4.6 10*3/uL (ref 1.4–6.5)
NEUTROS PCT: 69 %
Platelets: 135 10*3/uL — ABNORMAL LOW (ref 150–440)
RBC: 3.98 MIL/uL (ref 3.80–5.20)
RDW: 12.9 % (ref 11.5–14.5)
WBC: 6.6 10*3/uL (ref 3.6–11.0)

## 2016-09-03 LAB — URINALYSIS, COMPLETE (UACMP) WITH MICROSCOPIC
BACTERIA UA: NONE SEEN
Bilirubin Urine: NEGATIVE
Glucose, UA: NEGATIVE mg/dL
KETONES UR: NEGATIVE mg/dL
LEUKOCYTES UA: NEGATIVE
NITRITE: NEGATIVE
PROTEIN: NEGATIVE mg/dL
Specific Gravity, Urine: 1.013 (ref 1.005–1.030)
pH: 6 (ref 5.0–8.0)

## 2016-09-03 LAB — COMPREHENSIVE METABOLIC PANEL
ALT: 13 U/L — ABNORMAL LOW (ref 14–54)
ANION GAP: 5 (ref 5–15)
AST: 24 U/L (ref 15–41)
Albumin: 2.9 g/dL — ABNORMAL LOW (ref 3.5–5.0)
Alkaline Phosphatase: 77 U/L (ref 38–126)
BUN: 15 mg/dL (ref 6–20)
CHLORIDE: 108 mmol/L (ref 101–111)
CO2: 29 mmol/L (ref 22–32)
Calcium: 8.5 mg/dL — ABNORMAL LOW (ref 8.9–10.3)
Creatinine, Ser: 0.77 mg/dL (ref 0.44–1.00)
Glucose, Bld: 107 mg/dL — ABNORMAL HIGH (ref 65–99)
POTASSIUM: 3.8 mmol/L (ref 3.5–5.1)
Sodium: 142 mmol/L (ref 135–145)
Total Bilirubin: 0.6 mg/dL (ref 0.3–1.2)
Total Protein: 6.1 g/dL — ABNORMAL LOW (ref 6.5–8.1)

## 2016-09-03 LAB — TROPONIN I: Troponin I: <0.03 ng/mL (ref <0.03)

## 2016-09-03 MED ORDER — ACETAMINOPHEN 325 MG PO TABS: 650.0000 mg | ORAL_TABLET | Freq: Once | ORAL | Status: DC

## 2016-09-03 MED ORDER — SODIUM CHLORIDE 0.9 % IV BOLUS (SEPSIS)
500.0000 mL | Freq: Once | INTRAVENOUS | Status: AC
  Administered 2016-09-03: 500 mL via INTRAVENOUS

## 2016-09-03 NOTE — ED Triage Notes (Signed)
Pt here with caregiver that reports pt began yesterday having weakness and new onset of immobility. Pt has also been c/o sore throat. Pt has dementia and confused at baseline.

## 2016-09-03 NOTE — ED Provider Notes (Signed)
[facility] Emergency Department Provider Note  ____________________________________________   I have reviewed the triage vital signs and the nursing notes.   HISTORY  Chief Complaint Weakness and Sore Throat    HPI Tiffany Krause is a 83 y.o. female who lives at home. She had a slight sore throat. Patient has baseline dementia she is not different in that respect. She was given new medication to help her sleep and after getting them, yesterday she was more sleepy than normal. For that reason they have stopped giving it to her. It was Ativan. She's only had a few doses. She did walk around as much yesterday but today she is back to baseline but they want to make sure "everything okay". Denies any fever chills nausea vomiting diarrhea abdominal pain she denies dysuria or urinary frequency, she denies diarrhea, she denies chest pain or shortness of breath she denies cough.      Past Medical History:  Diagnosis Date  . Adult hypothyroidism 05/14/2015   Overview:  Multinodular goiter.   . CA of skin 05/14/2015   Overview:  PREVIOUS   . Chronic interstitial cystitis 05/14/2015  . Colon polyp 05/14/2015  . Degeneration macular 05/14/2015  . Dementia   . 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 Extensive workup in 2012 - negative cystoscopy, treated UTI, treated with Vesicare (nausea), Uribel, Pyridium, Detrol - all with no relief of symptoms. Per [facility], she is diagnosed with interstitial cystitis though patient is not aware of that diagnosis.   . Glaucoma   . HLD (hyperlipidemia) 05/14/2015  . Intractable low back pain 04/30/2015  . Macular degeneration   . MI (mitral incompetence) 03/10/2011  . Osteopenia 05/14/2015  . Osteoporosis, post-menopausal 05/14/2015  . Sacral fracture, closed ([place]) 04/30/2015  . Stress fracture of sacrum 05/02/2015  . Thyroid disease      Patient Active Problem List   Diagnosis Date Noted  . UTI (lower urinary tract infection) 06/17/2015  . SUI (stress urinary incontinence, female) 06/17/2015  . Chronic interstitial cystitis 05/14/2015  . Colon polyp 05/14/2015  . HLD (hyperlipidemia) 05/14/2015  . Adult hypothyroidism 05/14/2015  . Degeneration macular 05/14/2015  . Osteopenia 05/14/2015  . Osteoporosis, post-menopausal 05/14/2015  . CA of skin 05/14/2015  . Urinary retention 05/14/2015  . Atrophic vaginitis 05/14/2015  . Stress fracture of sacrum 05/02/2015  . Sacral fracture, closed ([place]) 04/30/2015  . Intractable low back pain 04/30/2015  . Glaucoma 03/10/2011  . MI (mitral incompetence) 03/10/2011  . 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 CATARACT EXTRACTION    . CHOLECYSTECTOMY  2003  . TONSILLECTOMY      Prior to Admission medications   Medication Sig Start Date End Date Taking? Authorizing Provider  acetaminophen (TYLENOL) 325 MG tablet Take 650 mg by mouth 4 (four) times daily as needed for mild pain. Reported on 06/17/2015   Yes Historical Provider, MD  bisacodyl (DULCOLAX) 10 MG suppository Place 10 mg rectally as needed for moderate constipation. Reported on 06/17/2015   Yes Historical Provider, MD  docusate sodium (COLACE) 100 MG capsule Take 1 capsule (100 mg total) by mouth 2 (two) times daily. 05/02/15  Yes Vaughan Basta, MD  ibandronate (BONIVA) 150 MG tablet Take 150 mg by mouth every 30 (thirty) days. Pt takes on the 10th of every month.   Take in the morning with a full glass  of water, on an empty stomach, and do not take anything else by mouth or lie down for the next 30 min.   Yes Historical Provider, MD  levothyroxine (SYNTHROID, LEVOTHROID) 88 MCG tablet Take 88 mcg by mouth daily before breakfast.   Yes Historical Provider, MD  LORazepam (ATIVAN) 0.5 MG tablet Take 0.5 mg by mouth every 4 (four) hours as needed for anxiety.    Yes Historical Provider, MD  Multiple Vitamins-Minerals (PRESERVISION AREDS 2) CAPS Take 1 capsule by mouth 2 (two) times daily.   Yes Historical Provider, MD  nitrofurantoin (MACRODANTIN) 50 MG capsule Take 50 mg by mouth at bedtime.   Yes Historical Provider, MD  oxybutynin (DITROPAN) 5 MG tablet Take 5 mg by mouth 2 (two) times daily.    Yes Historical Provider, MD  polyethylene glycol (MIRALAX / GLYCOLAX) packet Take 17 g by mouth daily as needed for mild constipation. 05/02/15  Yes Vaughan Basta, MD  QUEtiapine (SEROQUEL) 25 MG tablet Take 25 mg by mouth at bedtime.   Yes Historical Provider, MD  timolol (TIMOPTIC-XR) 0.5 % ophthalmic gel-forming Place 1 drop into both eyes daily.    Yes Historical Provider, MD  calcium-vitamin D 250-100 MG-UNIT tablet Take 1 tablet by mouth 2 (two) times daily. Patient not taking: Reported on 06/17/2015 05/02/15   Vaughan Basta, MD  conjugated estrogens (PREMARIN) vaginal cream Place 1 Applicatorful vaginally daily. Apply 0.5mg  (pea-sized amount)  just inside the vaginal introitus with a finger-tip every night for two weeks and then Monday, Wednesday and Friday nights. Patient not taking: Reported on 09/03/2016 05/14/15   Larene Beach A McGowan, PA-C  diclofenac (FLECTOR) 1.3 % PTCH Place 1 patch onto the skin 2 (two) times daily. Reported on 06/17/2015    Historical Provider, MD  estradiol (ESTRACE VAGINAL) 0.1 MG/GM vaginal cream Reported on 06/17/2015 01/29/11   Historical Provider, MD  gentamicin (GARAMYCIN) 0.3 % ophthalmic solution Reported on 06/17/2015 02/26/11   Historical Provider, MD  magnesium citrate SOLN Take 1 Bottle by mouth as needed for severe constipation. Reported on 06/17/2015    Historical Provider, MD  Multiple Vitamin (MULTIVITAMIN WITH MINERALS) TABS tablet Take 1 tablet by mouth daily.    Historical Provider, MD  oxyCODONE-acetaminophen (PERCOCET) 5-325 MG tablet Take 2 tablets by mouth every 6 (six) hours as needed for moderate pain or  severe pain. Patient not taking: Reported on 05/26/2015 05/02/15   Vaughan Basta, MD  tamsulosin (FLOMAX) 0.4 MG CAPS capsule Take 1 capsule (0.4 mg total) by mouth daily. Patient not taking: Reported on 09/03/2016 05/02/15   Vaughan Basta, MD  traMADol (ULTRAM) 50 MG tablet Take 1 tablet (50 mg total) by mouth every 6 (six) hours as needed for moderate pain. Patient not taking: Reported on 05/26/2015 05/02/15   Vaughan Basta, MD    Allergies Penicillins; Doxycycline hyclate; Erythromycin; and Fosamax [alendronate]  Family History  Problem Relation Age of Onset  . Diabetes Neg Hx   . Bladder Cancer Neg Hx   . Prostate cancer Neg Hx   . Kidney cancer Neg Hx     Social History Social History  Substance Use Topics  . Smoking status: Never Smoker  . Smokeless tobacco: Not on file  . Alcohol use 0.6 oz/week    1 Glasses of wine per week     Comment: 1-2x a month    Review of Systems Constitutional: No fever/chills Eyes: No visual changes. ENT: No sore throat. No stiff neck no neck pain Cardiovascular: Denies chest pain. Respiratory:  Denies shortness of breath. Gastrointestinal:   no vomiting.  No diarrhea.  No constipation. Genitourinary: Negative for dysuria. Musculoskeletal: Negative lower extremity swelling Skin: Negative for rash. Neurological: Negative for severe headaches, focal weakness or numbness. 10-point ROS otherwise negative.  ____________________________________________   PHYSICAL EXAM:  VITAL SIGNS: ED Triage Vitals  Enc Vitals Group     BP 09/03/16 0846 (!) 111/51     Pulse Rate 09/03/16 0846 72     Resp 09/03/16 0846 20     Temp 09/03/16 0846 97.9 F (36.6 C)     Temp Source 09/03/16 0846 Oral     SpO2 09/03/16 0846 95 %     Weight 09/03/16 0845 123 lb (55.8 kg)     Height --      Head Circumference --      Peak Flow --      Pain Score --      Pain Loc --      Pain Edu? --      Excl. in [place]? --     Constitutional:  Alert and orientedTo name and place unsure of the date. Well appearing and in no acute distress. Eyes: Conjunctivae are normal. PERRL. EOMI. Head: Atraumatic. Nose: No congestion/rhinnorhea. Mouth/Throat: Mucous membranes are moist.  Oropharynx non-erythematous. Neck: No stridor.   Nontender with no meningismus Cardiovascular: Normal rate, regular rhythm. Grossly normal heart sounds.  Good peripheral circulation. Respiratory: Normal respiratory effort.  No retractions. Lungs CTAB. Abdominal: Soft and nontender. No distention. No guarding no rebound Back:  There is no focal tenderness or step off.  there is no midline tenderness there are no lesions noted. there is no CVA tenderness Musculoskeletal: No lower extremity tenderness, no upper extremity tenderness. No joint effusions, no DVT signs strong distal pulses no edema Neurologic:  Normal speech and language. No gross focal neurologic deficits are appreciated.  Skin:  Skin is warm, dry and intact. No rash noted. Psychiatric: Mood and affect are normal. Speech and behavior are normal.  ____________________________________________   LABS (all labs ordered are listed, but only abnormal results are displayed)  Labs Reviewed  COMPREHENSIVE METABOLIC PANEL - Abnormal; Notable for the following:       Result Value   Glucose, Bld 107 (*)    Calcium 8.5 (*)    Total Protein 6.1 (*)    Albumin 2.9 (*)    ALT 13 (*)    All other components within normal limits  CBC WITH DIFFERENTIAL/PLATELET - Abnormal; Notable for the following:    Platelets 135 (*)    All other components within normal limits  URINALYSIS, COMPLETE (UACMP) WITH MICROSCOPIC - Abnormal; Notable for the following:    Color, Urine YELLOW (*)    APPearance CLEAR (*)    Hgb urine dipstick LARGE (*)    Squamous Epithelial / LPF 0-5 (*)    All other components within normal limits  URINE CULTURE  TROPONIN I   ____________________________________________  EKG  I  personally interpreted any EKGs ordered by me or triage Normal sinus rhythm rate 67 bpm no acute ST elevation or acute ST depression normal axis unremarkable EKG ____________________________________________  RADIOLOGY  I reviewed any imaging ordered by me or triage that were performed during my shift and, if possible, patient and/or family made aware of any abnormal findings. ____________________________________________   PROCEDURES  Procedure(s) performed: None  Procedures  Critical Care performed: None  ____________________________________________   INITIAL IMPRESSION / ASSESSMENT AND PLAN / ED COURSE  Pertinent labs &  imaging results that were available during my care of the patient were reviewed by me and considered in my medical decision making (see chart for details).  We did extensive workup on this patient because of her age and somewhat vague complaints. She does have some hematuria which she will follow-up with her primary care doctor for. She has a slight sore throat but her oropharynx is clear with no evidence of RPA or TPA or strep. She is afebrile her blood work is reassuring. She has no complaints except for the desire to go home she is awake and alert. She may be mildly dehydrated after fluid she is certainly very active in the room and at her baseline family very reassured and would like to be discharged at this time. Discharge and return precautions given and understood.    ____________________________________________   FINAL CLINICAL IMPRESSION(S) / ED DIAGNOSES  Final diagnoses:  None      This chart was dictated using voice recognition software.  Despite best efforts to proofread,  errors can occur which can change meaning.      Schuyler Amor, MD 09/03/16 [phone]

## 2016-09-05 LAB — URINE CULTURE: CULTURE: NO GROWTH

## 2016-09-21 ENCOUNTER — Other Ambulatory Visit: Payer: Self-pay | Admitting: Internal Medicine

## 2016-10-01 ENCOUNTER — Other Ambulatory Visit: Payer: Self-pay | Admitting: Internal Medicine

## 2016-10-01 DIAGNOSIS — R1310 Dysphagia, unspecified: Secondary | ICD-10-CM

## 2016-10-22 ENCOUNTER — Ambulatory Visit
Admission: RE | Admit: 2016-10-22 | Discharge: 2016-10-22 | Disposition: A | Discharge status: Home / Self Care | Payer: Medicare Other | Source: Ambulatory Visit | Attending: Internal Medicine | Admitting: Internal Medicine

## 2016-10-22 DIAGNOSIS — R1310 Dysphagia, unspecified: Secondary | ICD-10-CM | POA: Insufficient documentation

## 2016-10-22 DIAGNOSIS — R1312 Dysphagia, oropharyngeal phase: Secondary | ICD-10-CM

## 2016-10-22 NOTE — Therapy (Signed)
[place], [place], [address] Phone: [phone]   Fax:     Modified Barium Swallow  Patient Details  Name: Tiffany Krause MRN: [id number] Date of Birth: 06-15-1920 No Data Recorded  Encounter Date: 10/22/2016      End of Session - 10/22/16 1430    Visit Number 1   Number of Visits 1   Date for SLP Re-Evaluation 10/22/16   SLP Start Time 51   SLP Stop Time  1330   SLP Time Calculation (min) 60 min   Activity Tolerance Patient tolerated treatment well      Past Medical History:  Diagnosis Date  . Adult hypothyroidism 05/14/2015   Overview:  Multinodular goiter.   . CA of skin 05/14/2015   Overview:  PREVIOUS   . Chronic interstitial cystitis 05/14/2015  . Colon polyp 05/14/2015  . Degeneration macular 05/14/2015  . Dementia   . 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 Extensive workup in 2012 - negative cystoscopy, treated UTI, treated with Vesicare (nausea), Uribel, Pyridium, Detrol - all with no relief of symptoms. Per [facility], she is diagnosed with interstitial cystitis though patient is not aware of that diagnosis.   . Glaucoma   . HLD (hyperlipidemia) 05/14/2015  . Intractable low back pain 04/30/2015  . Macular degeneration   . MI (mitral incompetence) 03/10/2011  . Osteopenia 05/14/2015  . Osteoporosis, post-menopausal 05/14/2015  . Sacral fracture, closed ([place]) 04/30/2015  . Stress fracture of sacrum 05/02/2015  . Thyroid disease     Past Surgical History:  Procedure Laterality Date  . CATARACT EXTRACTION    . CHOLECYSTECTOMY  2003  . TONSILLECTOMY      There were no vitals filed for this visit.   Subjective: Patient behavior: (alertness, ability to follow instructions, etc.): The patient has dementia but is oriented to swallowing and able to follow simple directions  Chief  complaint: Difficulty swallowing and coughing, per caregiver symptoms have improved since thrush has resolved   Objective:  Radiological Procedure: A videoflouroscopic evaluation of oral-preparatory, reflex initiation, and pharyngeal phases of the swallow was performed; as well as a screening of the upper esophageal phase.  I. POSTURE: Upright in MBS chair  II. VIEW: Lateral  III. COMPENSATORY STRATEGIES: N/A  IV. BOLUSES ADMINISTERED:   Thin Liquid: 2 small cup rim sips, 3 consecutive swallows   Nectar-thick Liquid: 1 moderate   Honey-thick Liquid: DNT   Puree: 1 teaspoon presentation   Mechanical Soft: 1/8/graham cracker in applesauce  V. RESULTS OF EVALUATION: A. ORAL PREPARATORY PHASE: (The lips, tongue, and velum are observed for strength and coordination)       **Overall Severity Rating: Mild; disorganized and slow posterior transfer  B. SWALLOW INITIATION/REFLEX: (The reflex is normal if "triggered" by the time the bolus reached the base of the tongue)  **Overall Severity Rating: Mild; triggers while falling from the valleculae to the pyriform sinuses  C. PHARYNGEAL PHASE: (Pharyngeal function is normal if the bolus shows rapid, smooth, and continuous transit through the pharynx and there is no pharyngeal residue after the swallow)  **Overall Severity Rating: Mild; decreased tongue base retraction and hyolaryngeal excursion with coating-to-mild pharyngeal residue  D. LARYNGEAL PENETRATION: (Material entering into the laryngeal inlet/vestibule but not aspirated) X1- transient with nectar-thick liquid  E. ASPIRATION: None  F. ESOPHAGEAL PHASE: (Screening of the upper esophagus): No observed abnormalities within the viewable  cervical esophagus.  ASSESSMENT: This 83 year old woman, with dementia and "cough after eating", is presenting with mild oropharyngeal dysphagia characterized by slow/disorganized oral management, delayed swallow initiation, and decreased pharyngeal  pressure generation.  There is coating-to-mild pharyngeal residue post swallow.  There was one episode of transient laryngeal penetration without tracheal aspiration.  The patient does not appear to be a significant risk for prandial aspiration.  Overall, the patient has a robust swallow for her age and diagnosis of dementia.  PLAN/RECOMMENDATIONS:   A. Diet: Regular as tolerated, may benefit from softer and moister foods   B. Swallowing Precautions: Small bites/sips, allow patient to swallow before having another bite, monitor for increased difficulty with swallowing   C. Recommended consultation to N/A   D. Therapy recommendations: not indicated at this time   E. Results and recommendations were discussed with the patient and her caregiver and the final report routed to the referring MD.    Oropharyngeal dysphagia - Plan: DG OP Swallowing Func-Medicare/Speech Path, DG OP Swallowing Func-Medicare/Speech Path      G-Codes - 10/27/16 1431    Functional Assessment Tool Used MBSS, clinical judgment   Functional Limitations Swallowing   Swallow Current Status (F5732) At least 20 percent but less than 40 percent impaired, limited or restricted   Swallow Goal Status (K0254) At least 20 percent but less than 40 percent impaired, limited or restricted   Swallow Discharge Status [phone]) At least 20 percent but less than 40 percent impaired, limited or restricted          Problem List Patient Active Problem List   Diagnosis Date Noted  . UTI (lower urinary tract infection) 06/17/2015  . SUI (stress urinary incontinence, female) 06/17/2015  . Chronic interstitial cystitis 05/14/2015  . Colon polyp 05/14/2015  . HLD (hyperlipidemia) 05/14/2015  . Adult hypothyroidism 05/14/2015  . Degeneration macular 05/14/2015  . Osteopenia 05/14/2015  . Osteoporosis, post-menopausal 05/14/2015  . CA of skin 05/14/2015  . Urinary retention 05/14/2015  . Atrophic vaginitis 05/14/2015  . Stress  fracture of sacrum 05/02/2015  . Sacral fracture, closed ([place]) 04/30/2015  . Intractable low back pain 04/30/2015  . Glaucoma 03/10/2011  . MI (mitral incompetence) 03/10/2011  . 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

## 2016-12-09 IMAGING — CR DG HIP (WITH OR WITHOUT PELVIS) 2-3V*R*
1 series · 3 of 3 positions shown · non-contrast
Comparison: None.

CLINICAL DATA: Bilateral hip pain, right worse than left since
falling out of a chair today. Initial encounter.

EXAM:
DG HIP (WITH OR WITHOUT PELVIS) 2-3V RIGHT

[Series 1: dg hip unilat w or w/o pelvis 2-3 views  · non-contrast · 0.14mm/px · 3 of 3 slices shown]
[im 1/3]
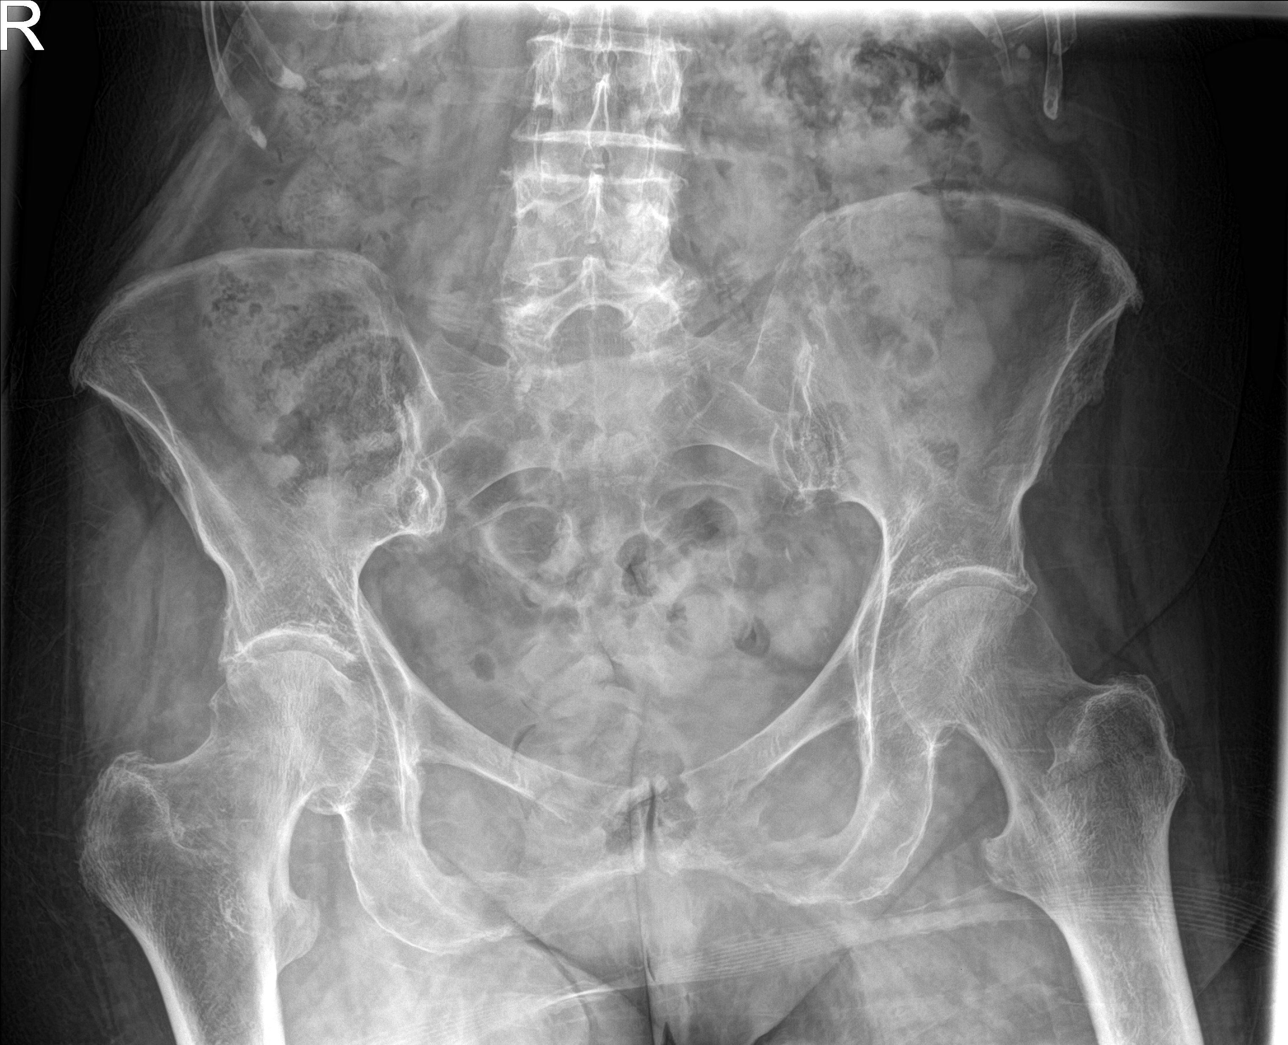
[im 2/3]
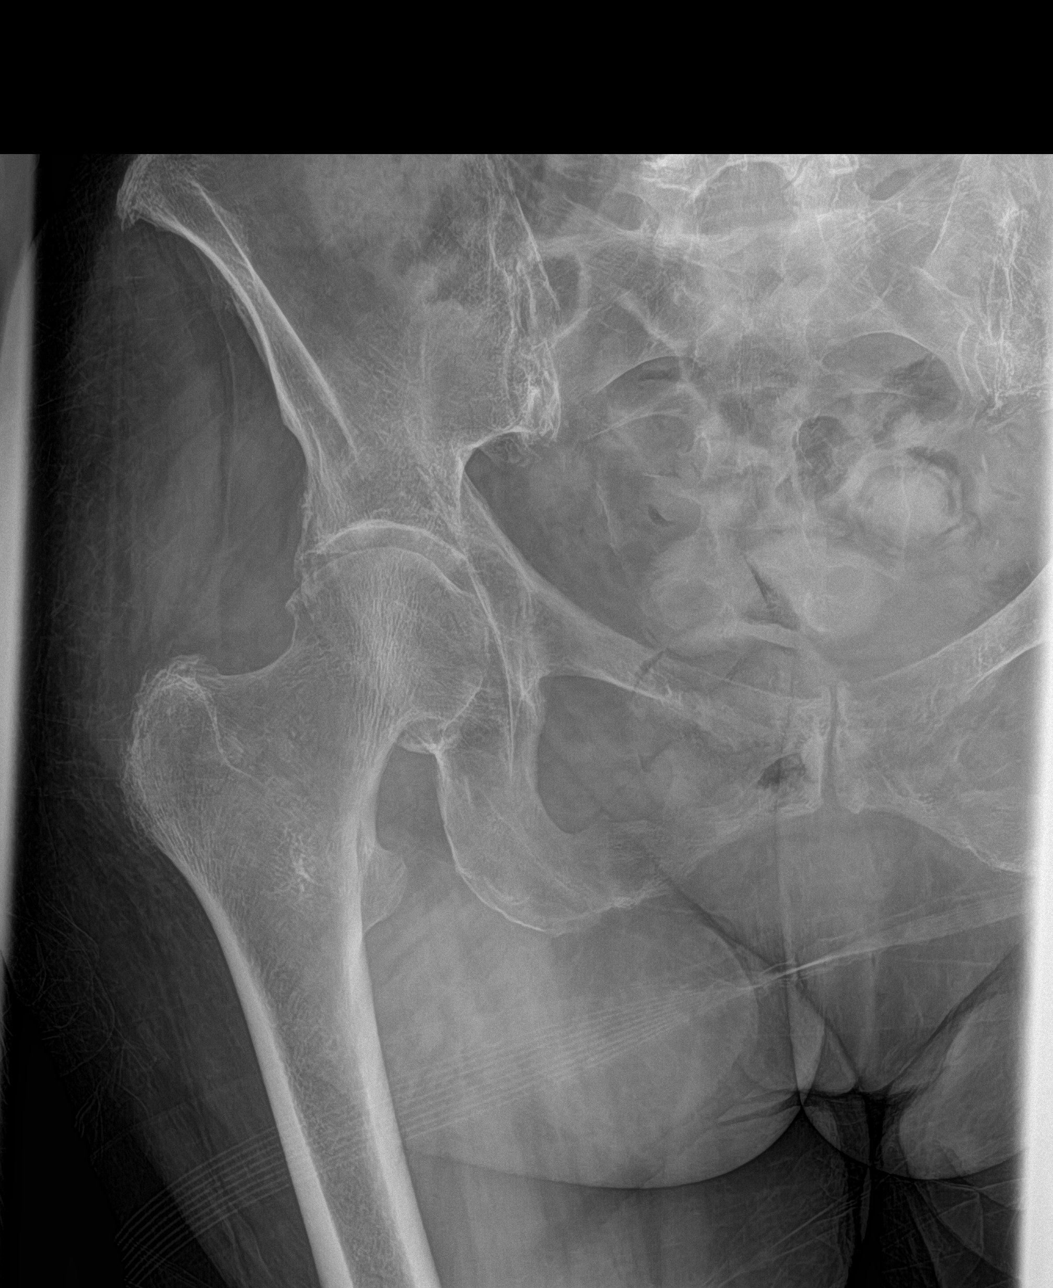
[im 3/3]
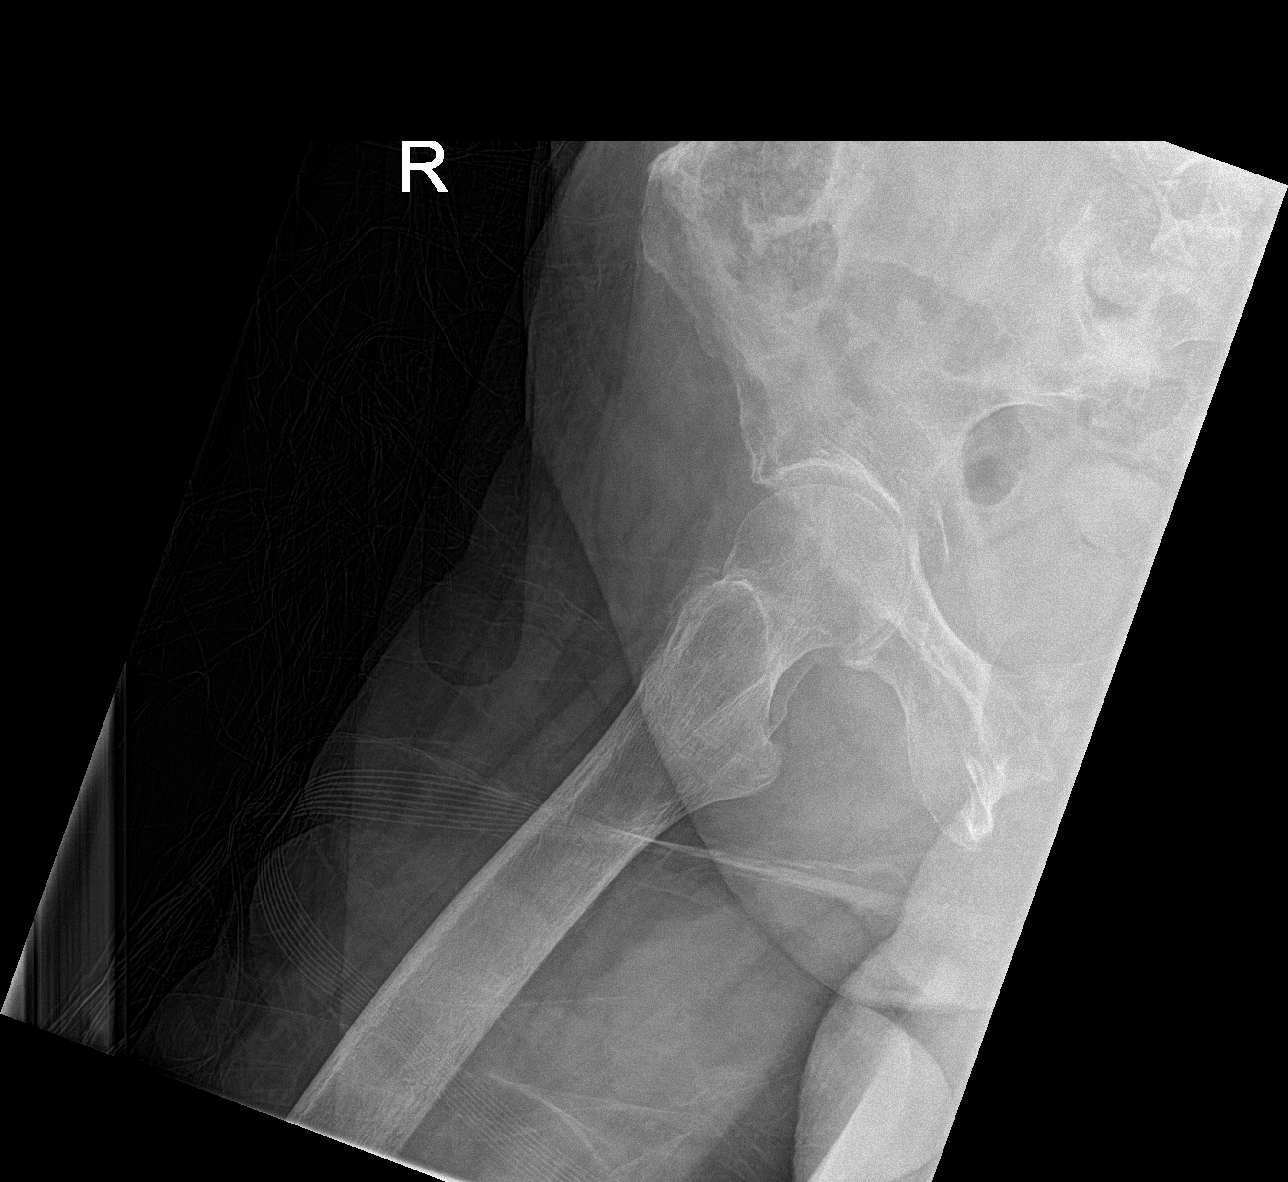

[3 of 3 positions shown; findings below may reference images not displayed]

FINDINGS: No acute bony or joint abnormality is seen. Mild degenerative change
is present about the hips. No focal bony lesion. Soft tissues are
unremarkable.
IMPRESSION: No acute abnormality.

## 2016-12-09 IMAGING — CR DG LUMBAR SPINE COMPLETE 4+V
1 series · 5 of 5 positions shown · non-contrast
Comparison: Plain films lumbar spine 11/23/2008.

ADDENDUM:
This addendum is given for the purpose of noting that the patient's
T11 compression fracture demonstrates up to approximately 80 percent
vertebral body height loss. No bony retropulsion is visible on this
study.
CLINICAL DATA: Low back pain after falling onto the floor this
evening. Initial encounter.

EXAM:
LUMBAR SPINE - COMPLETE 4+ VIEW

[Series 1: dg lumbar spine complete 4 +v · 0.14mm/px · 5 of 5 slices shown]
[im 1/5]
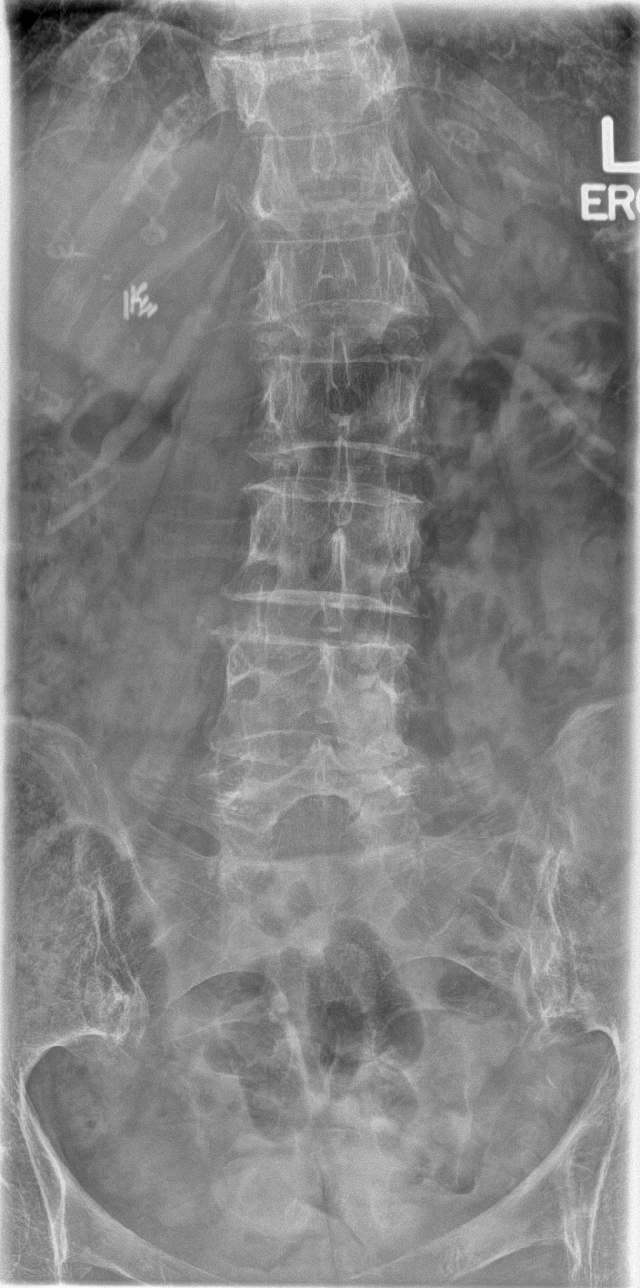
[im 2/5]
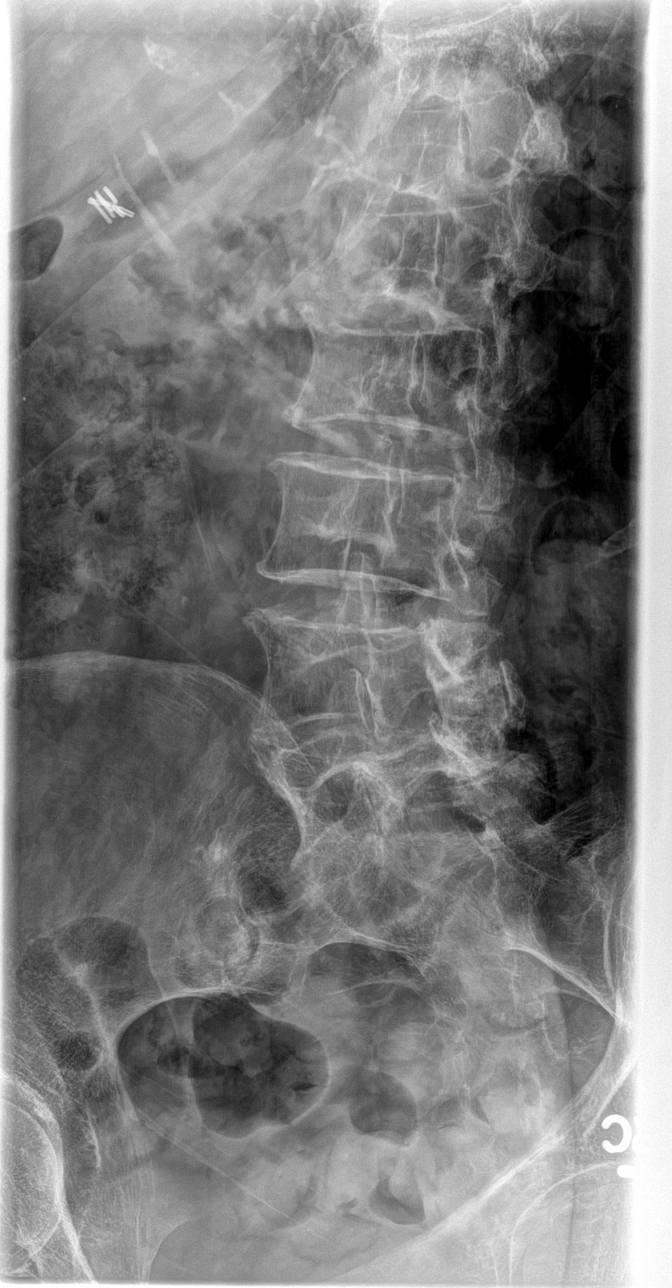
[im 3/5]
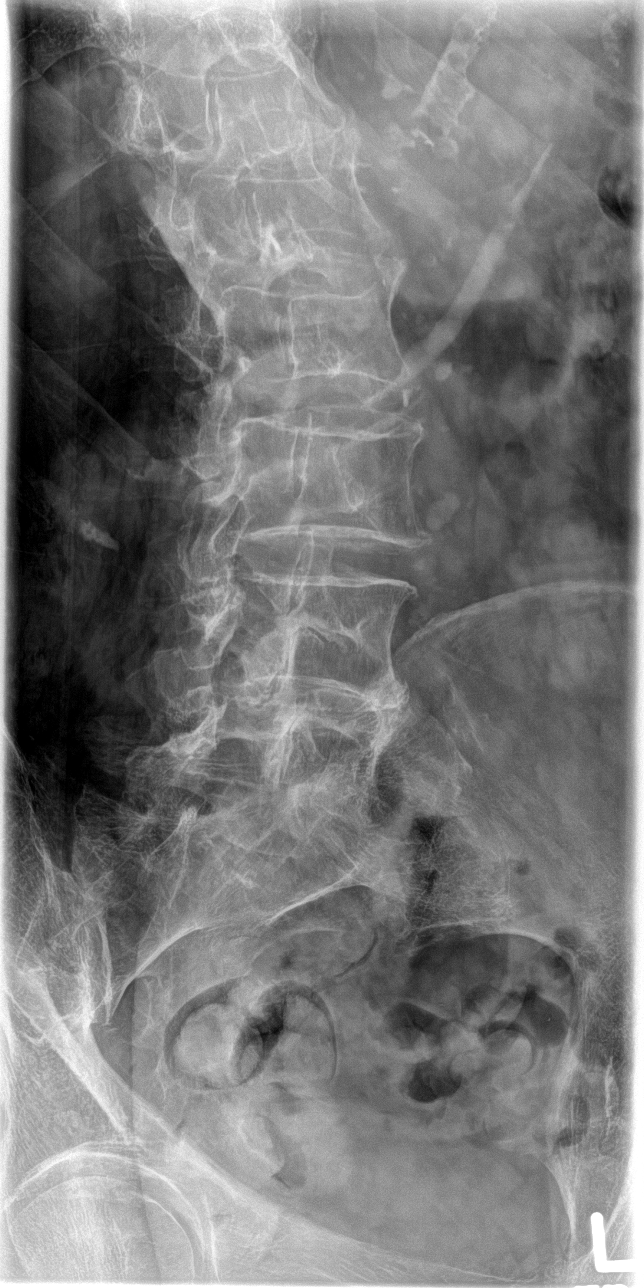
[im 4/5]
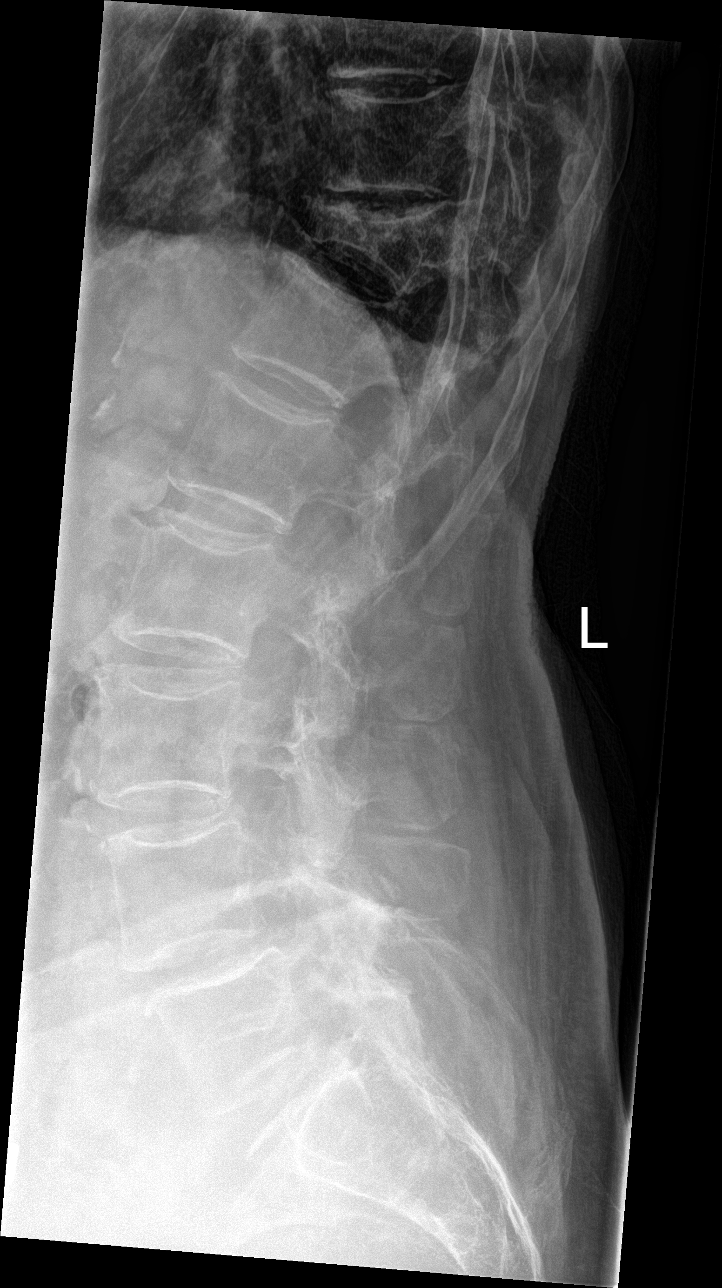
[im 5/5]
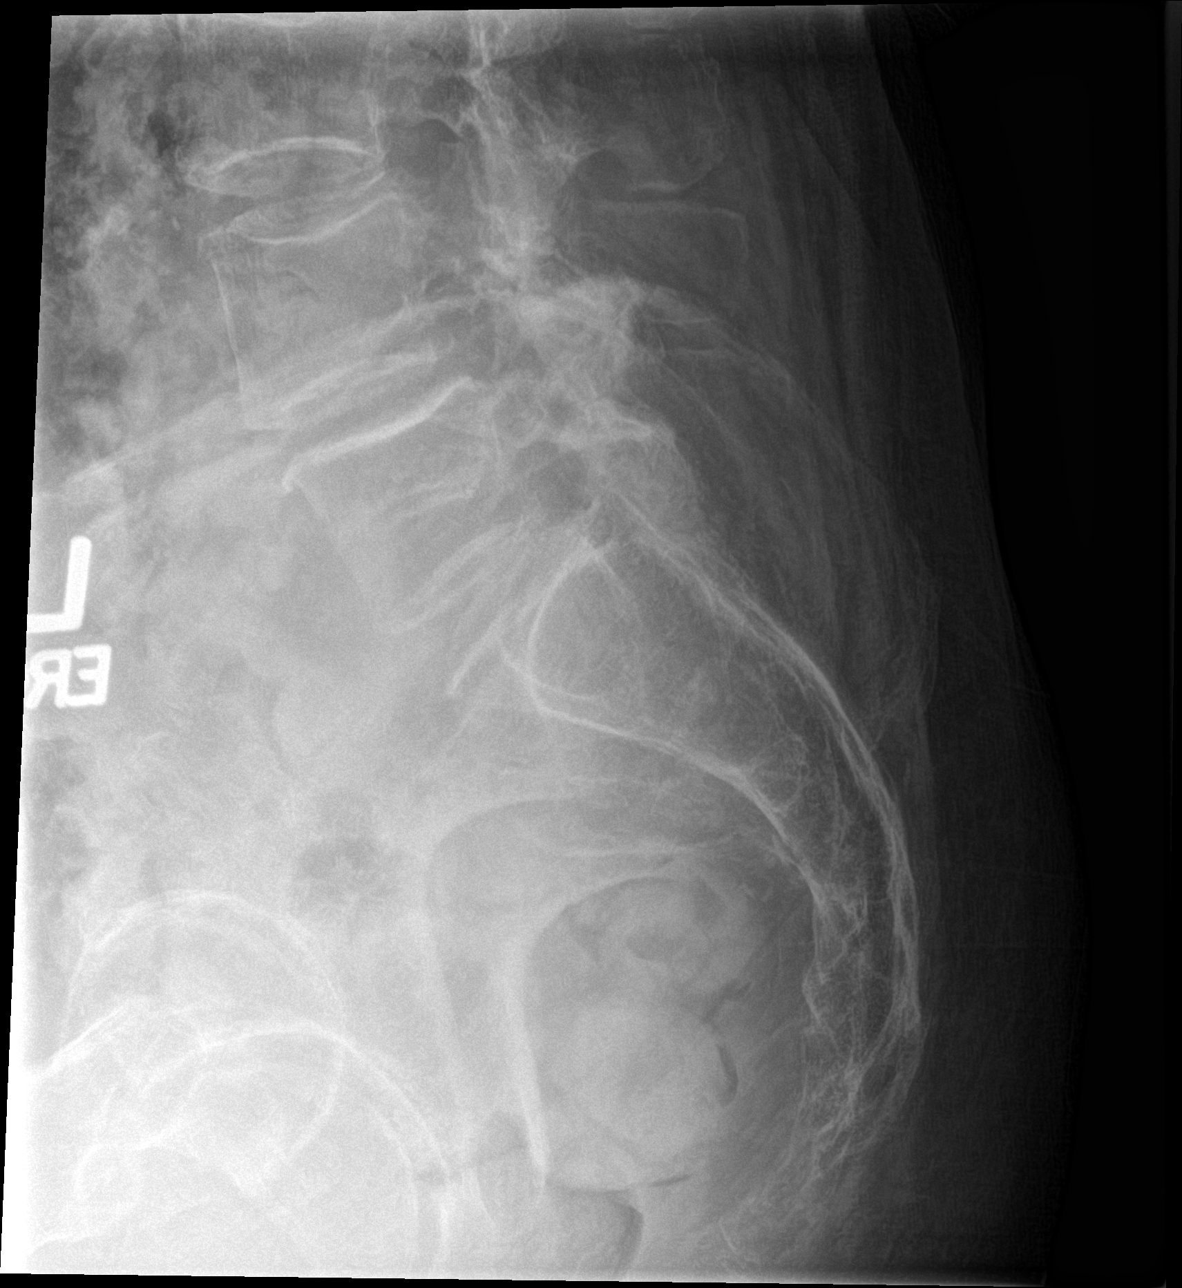

[5 of 5 positions shown; findings below may reference images not displayed]

FINDINGS: Patient has a superior endplate compression fracture of T11 which is
age indeterminate. No other fracture is identified. Lower lumbar
degenerative disease is seen. There is convex left scoliosis.
IMPRESSION: T11 compression fracture is age indeterminate. No other fracture is
identified.

## 2016-12-13 IMAGING — MR MR THORACIC SPINE W/O CM
4 of 6 series · 26 of 48 positions shown · non-contrast
Comparison: Lumbar radiographs 04/26/2015.

CLINICAL DATA: [AGE] female who fell 4 days ago with
continued pain. Age indeterminate T11 compression fracture seen
radiographically. Subsequent encounter.

EXAM:
MRI THORACIC SPINE WITHOUT CONTRAST
TECHNIQUE: Multiplanar, multisequence MR imaging of the thoracic spine was
performed. No intravenous contrast was administered.

[Series 5: T2 · sagittal · 3.0mm · 0.83mm/px · 6 of 18 slices shown (1 of 2)]
[im 1/18]
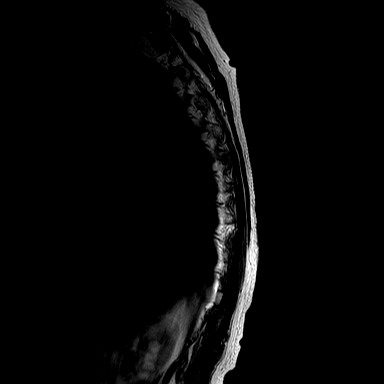
[im 4/18]
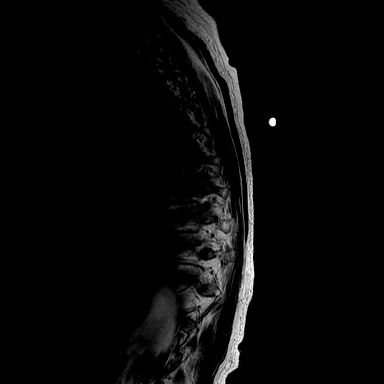
[im 7/18]
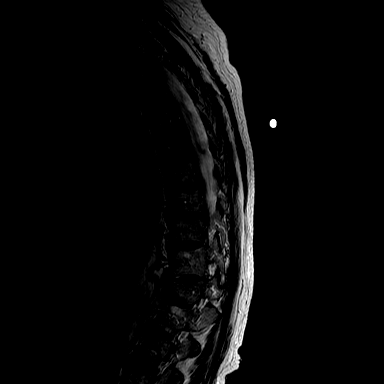
[im 11/18]
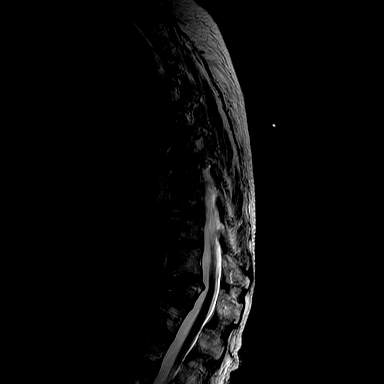
[im 14/18]
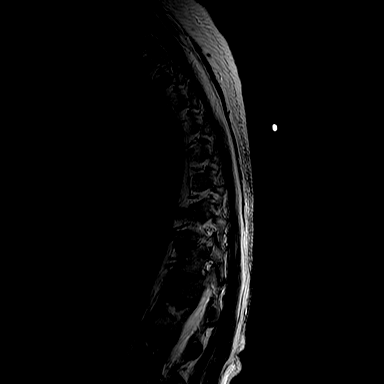
[im 18/18]
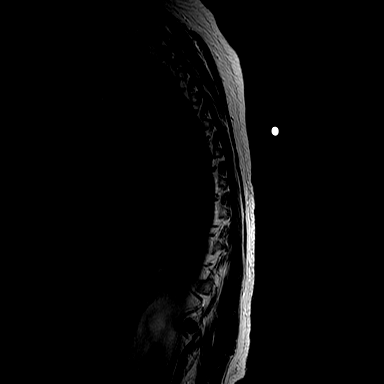

[Series 6: T1 · sagittal · 3.0mm · 0.62mm/px · 6 of 19 slices shown]
[im 1/19]
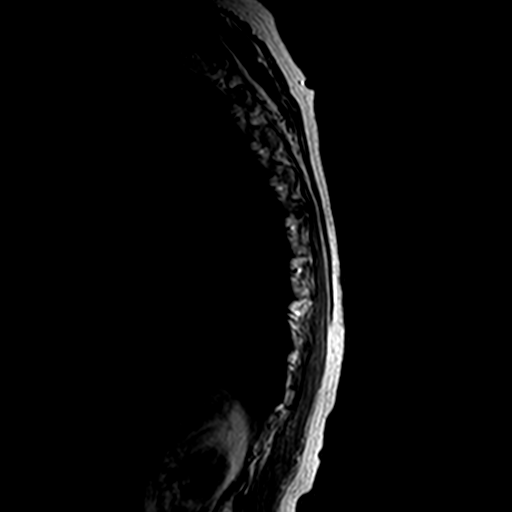
[im 4/19]
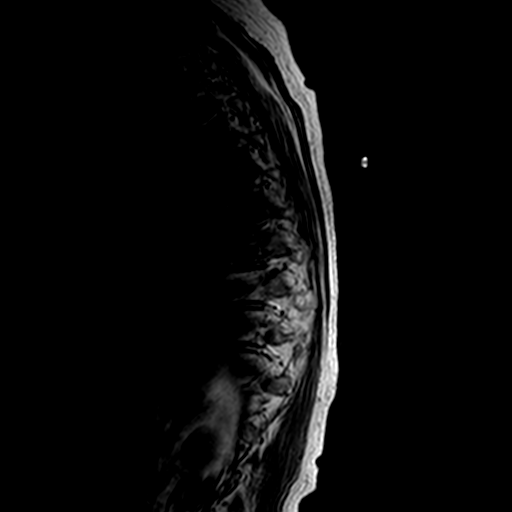
[im 8/19]
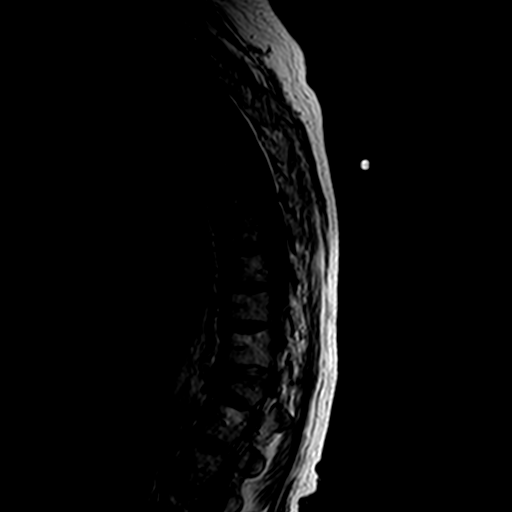
[im 11/19]
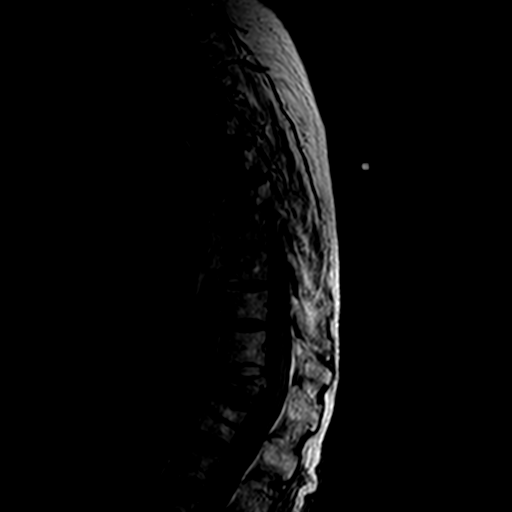
[im 15/19]
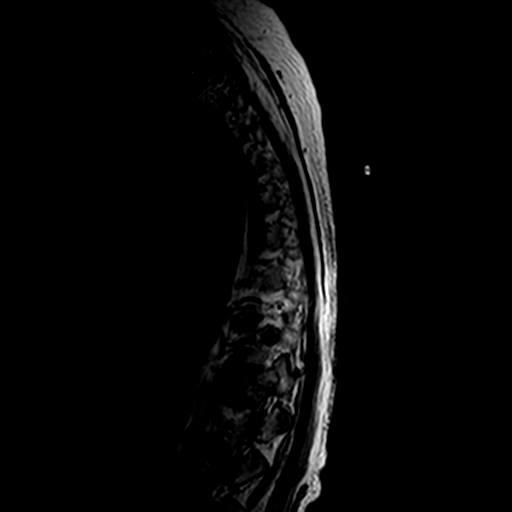
[im 19/19]
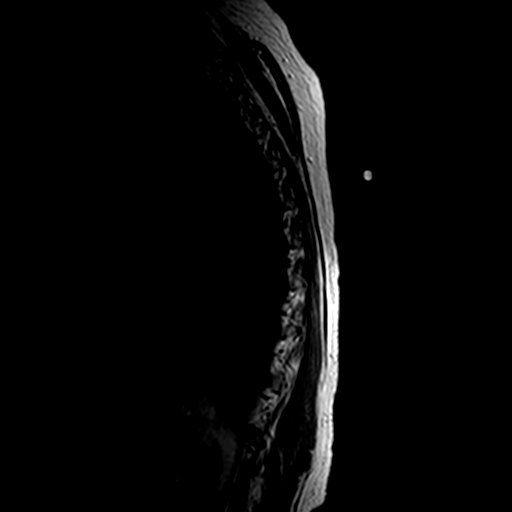

[Series 7: STIR · sagittal · 3.0mm · 0.62mm/px · 5 of 19 slices shown]
[im 1/19]
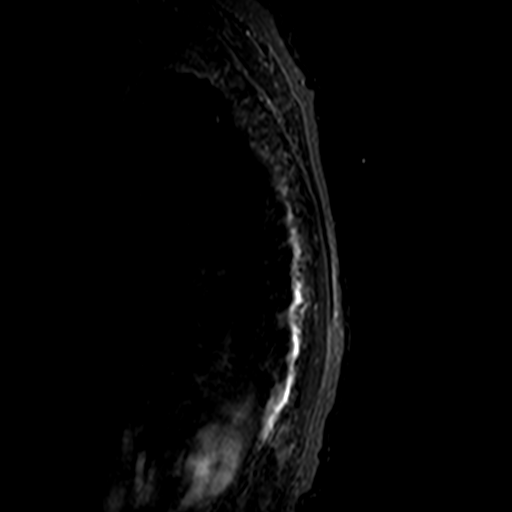
[im 4/19]
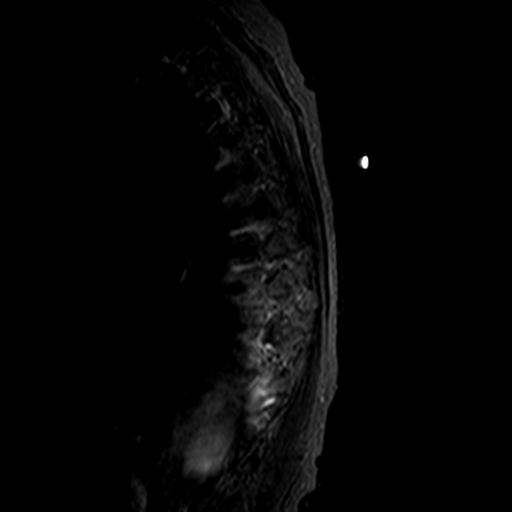
[im 8/19]
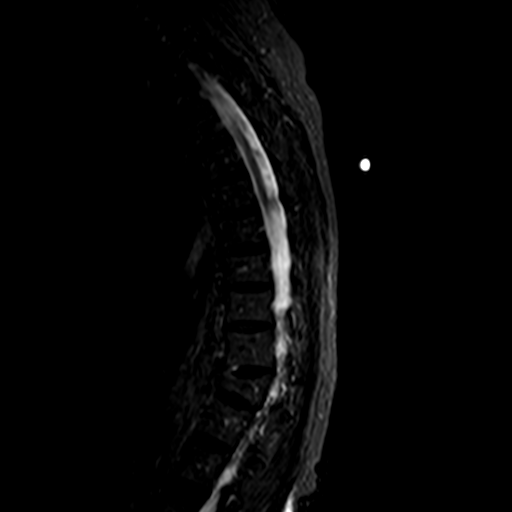
[im 11/19]
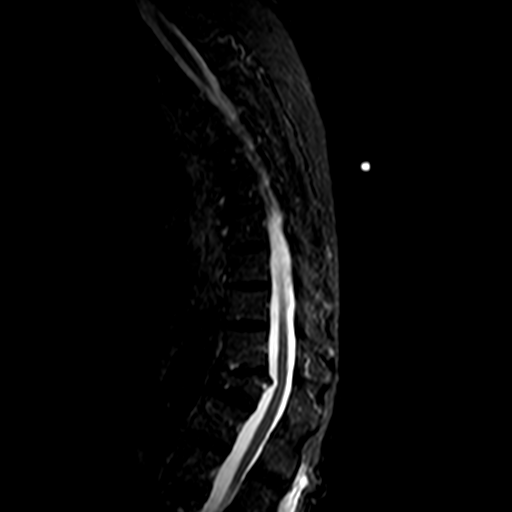
[im 19/19]
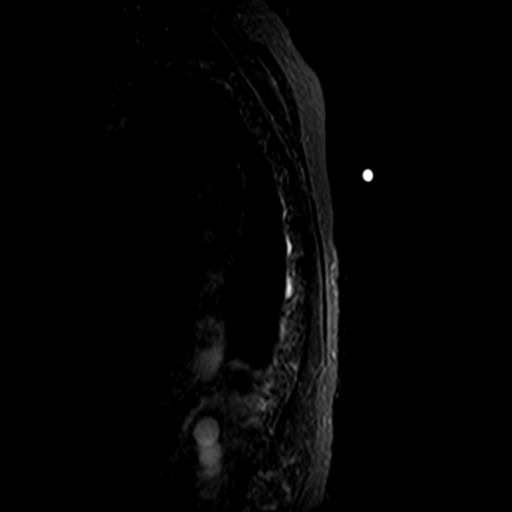

[Series 8: T2 · axial · 5.0mm · 0.57mm/px · z∈[-155,+28]mm · 9 of 41 slices shown (2 of 2)]
[im 1/41]
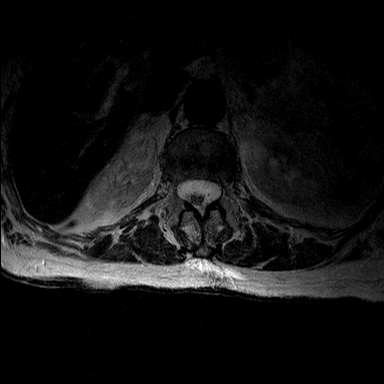
[im 7/41]
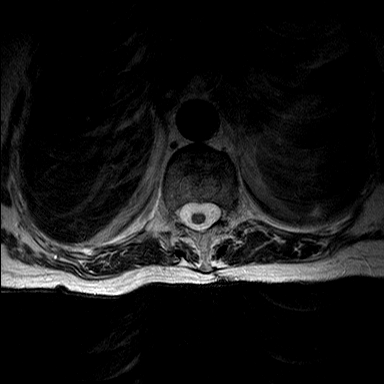
[im 14/41]
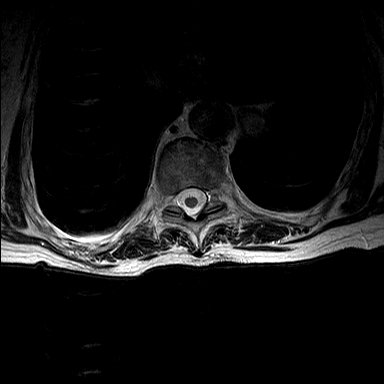
[im 17/41]
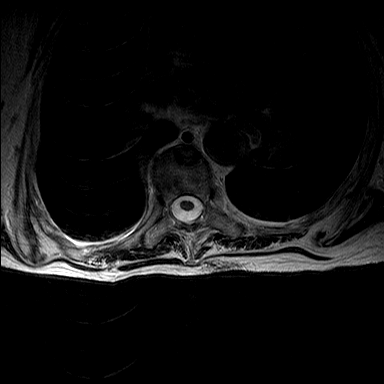
[im 21/41]
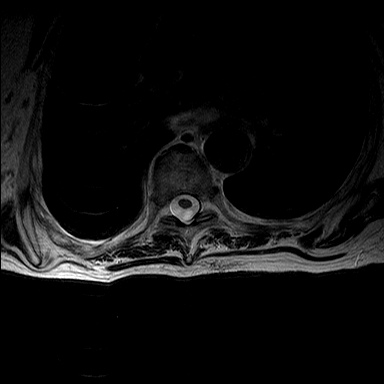
[im 24/41]
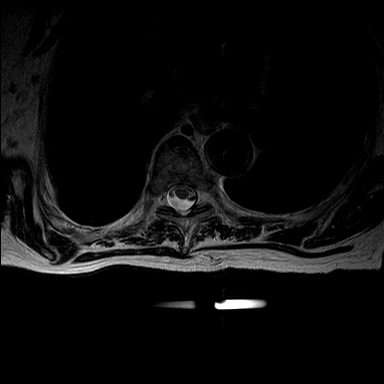
[im 27/41]
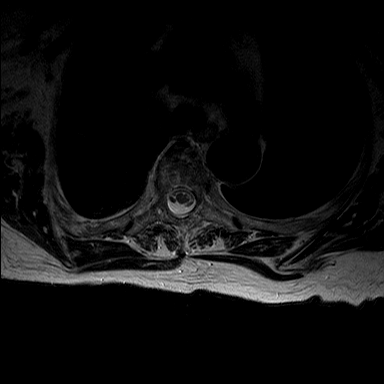
[im 34/41]
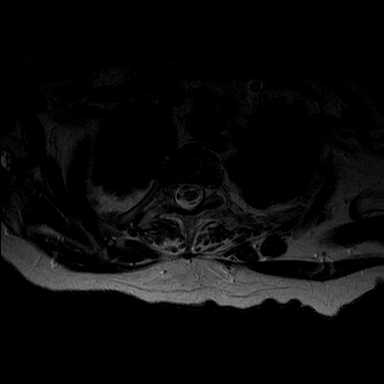
[im 41/41]
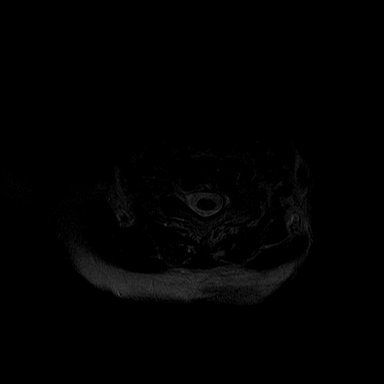

[26 of 48 positions shown; findings below may reference images not displayed]

FINDINGS: Limited sagittal imaging of the cervical spine is unremarkable.

Moderate compression of the T11 vertebral body re - demonstrated. No
associated marrow edema. Mild retropulsion of the posterior superior
endplate not resulting in significant spinal stenosis.

Thoracic vertebral height and alignment elsewhere within normal
limits. No marrow edema or evidence of acute osseous abnormality.

Capacious thoracic spinal canal. No spinal stenosis. Spinal cord
signal is within normal limits at all visualized levels. Conus
medullaris mostly visible at L1 and appears normal.

Trace layering pleural effusions. Cardiomegaly. Ectatic thoracic
aorta. Negative visualized upper abdominal viscera.
IMPRESSION: 1. Chronic T11 compression fracture. No acute osseous abnormality in
the thoracic spine. No significant spinal stenosis.
2. Trace bilateral pleural effusions.

## 2019-07-16 DEATH — deceased
